# Patient Record
Sex: Female | Born: 1965 | Race: White | Hispanic: No | Marital: Married | Smoking: Current every day smoker
Health system: Southern US, Community
[De-identification: ages and names within clinical notes are randomized; demographics above are authoritative.]

## PROBLEM LIST (undated history)

## (undated) DIAGNOSIS — I639 Cerebral infarction, unspecified: Secondary | ICD-10-CM

## (undated) DIAGNOSIS — F32A Depression, unspecified: Secondary | ICD-10-CM

## (undated) DIAGNOSIS — R519 Other chronic pain: Secondary | ICD-10-CM

## (undated) DIAGNOSIS — G8929 Other chronic pain: Secondary | ICD-10-CM

## (undated) DIAGNOSIS — J439 Emphysema, unspecified: Secondary | ICD-10-CM

## (undated) DIAGNOSIS — F419 Anxiety disorder, unspecified: Secondary | ICD-10-CM

## (undated) DIAGNOSIS — Z9289 Personal history of other medical treatment: Secondary | ICD-10-CM

## (undated) DIAGNOSIS — K635 Polyp of colon: Secondary | ICD-10-CM

## (undated) DIAGNOSIS — R51 Headache: Secondary | ICD-10-CM

## (undated) DIAGNOSIS — F329 Major depressive disorder, single episode, unspecified: Secondary | ICD-10-CM

## (undated) DIAGNOSIS — K2970 Gastritis, unspecified, without bleeding: Secondary | ICD-10-CM

## (undated) DIAGNOSIS — Z720 Tobacco use: Secondary | ICD-10-CM

## (undated) DIAGNOSIS — K219 Gastro-esophageal reflux disease without esophagitis: Secondary | ICD-10-CM

## (undated) HISTORY — DX: Depression, unspecified: F32.A

## (undated) HISTORY — DX: Gastro-esophageal reflux disease without esophagitis: K21.9

## (undated) HISTORY — DX: Anxiety disorder, unspecified: F41.9

## (undated) HISTORY — PX: SKIN GRAFT: SHX250

## (undated) HISTORY — DX: Tobacco use: Z72.0

## (undated) HISTORY — DX: Emphysema, unspecified: J43.9

## (undated) HISTORY — PX: POLYPECTOMY: SHX149

## (undated) HISTORY — PX: EXTERNAL EAR SURGERY: SHX627

## (undated) HISTORY — PX: COLONOSCOPY: SHX174

## (undated) HISTORY — DX: Major depressive disorder, single episode, unspecified: F32.9

---

## 2000-04-16 ENCOUNTER — Emergency Department (HOSPITAL_COMMUNITY): Admission: EM | Admit: 2000-04-16 | Discharge: 2000-04-16 | Payer: Self-pay | Admitting: *Deleted

## 2002-10-10 ENCOUNTER — Emergency Department (HOSPITAL_COMMUNITY): Admission: EM | Admit: 2002-10-10 | Discharge: 2002-10-10 | Payer: Self-pay | Admitting: Emergency Medicine

## 2004-02-20 ENCOUNTER — Emergency Department (HOSPITAL_COMMUNITY): Admission: EM | Admit: 2004-02-20 | Discharge: 2004-02-20 | Payer: Self-pay | Admitting: Emergency Medicine

## 2004-02-23 ENCOUNTER — Ambulatory Visit (HOSPITAL_COMMUNITY): Admission: RE | Admit: 2004-02-23 | Discharge: 2004-02-23 | Payer: Self-pay | Admitting: *Deleted

## 2004-02-28 ENCOUNTER — Ambulatory Visit (HOSPITAL_COMMUNITY): Admission: RE | Admit: 2004-02-28 | Discharge: 2004-02-28 | Payer: Self-pay | Admitting: *Deleted

## 2005-08-12 ENCOUNTER — Emergency Department (HOSPITAL_COMMUNITY): Admission: EM | Admit: 2005-08-12 | Discharge: 2005-08-12 | Payer: Self-pay | Admitting: Emergency Medicine

## 2006-03-13 ENCOUNTER — Emergency Department (HOSPITAL_COMMUNITY): Admission: EM | Admit: 2006-03-13 | Discharge: 2006-03-13 | Payer: Self-pay | Admitting: Emergency Medicine

## 2006-08-25 ENCOUNTER — Emergency Department (HOSPITAL_COMMUNITY): Admission: EM | Admit: 2006-08-25 | Discharge: 2006-08-25 | Payer: Self-pay | Admitting: Emergency Medicine

## 2008-12-27 ENCOUNTER — Emergency Department (HOSPITAL_COMMUNITY): Admission: EM | Admit: 2008-12-27 | Discharge: 2008-12-27 | Payer: Self-pay | Admitting: Emergency Medicine

## 2009-05-03 ENCOUNTER — Emergency Department (HOSPITAL_COMMUNITY): Admission: EM | Admit: 2009-05-03 | Discharge: 2009-05-03 | Payer: Self-pay | Admitting: Emergency Medicine

## 2010-05-24 NOTE — Procedures (Signed)
   [name], [name] NO.:  [id number]   MEDICAL RECORD NO.:  [id number]                   PATIENT TYPE:  EMS   LOCATION:  ED                                   FACILITY:  [facility]   PHYSICIAN:  Edward L. Juanetta Gosling, M.D.             DATE OF BIRTH:  [birth year]   DATE OF PROCEDURE:  10/10/2002  DATE OF DISCHARGE:                                EKG INTERPRETATION   1029 hours on October 10, 2002.   RESULTS:  The rhythm is sinus rhythm with rate in the 70s.  There is  probable left atrial enlargement.  Small Q waves are seen inferiorly and  laterally and these are probably of no clinical significance, but  correlation is suggestion.   IMPRESSION:  Minimally abnormal electrocardiogram.      ___________________________________________                                            Oneal Deputy. Juanetta Gosling, M.D.   ELH/MEDQ  D:  10/10/2002  T:  10/11/2002  Job:  [id number]

## 2011-03-03 ENCOUNTER — Emergency Department (HOSPITAL_COMMUNITY)
Admission: EM | Admit: 2011-03-03 | Discharge: 2011-03-03 | Disposition: A | Discharge status: Home / Self Care | Payer: BC Managed Care – PPO | Attending: Emergency Medicine | Admitting: Emergency Medicine

## 2011-03-03 ENCOUNTER — Encounter (HOSPITAL_COMMUNITY): Payer: Self-pay | Admitting: Emergency Medicine

## 2011-03-03 ENCOUNTER — Emergency Department (HOSPITAL_COMMUNITY): Payer: BC Managed Care – PPO

## 2011-03-03 ENCOUNTER — Other Ambulatory Visit: Payer: Self-pay

## 2011-03-03 DIAGNOSIS — M79609 Pain in unspecified limb: Secondary | ICD-10-CM | POA: Insufficient documentation

## 2011-03-03 DIAGNOSIS — R079 Chest pain, unspecified: Secondary | ICD-10-CM | POA: Insufficient documentation

## 2011-03-03 DIAGNOSIS — R209 Unspecified disturbances of skin sensation: Secondary | ICD-10-CM | POA: Insufficient documentation

## 2011-03-03 DIAGNOSIS — R5381 Other malaise: Secondary | ICD-10-CM | POA: Insufficient documentation

## 2011-03-03 LAB — BASIC METABOLIC PANEL
CO2: 24 mEq/L (ref 19–32)
Calcium: 9 mg/dL (ref 8.4–10.5)
GFR calc non Af Amer: >90 mL/min (ref >90)
Glucose, Bld: 88 mg/dL (ref 70–99)

## 2011-03-03 LAB — DIFFERENTIAL
Eosinophils Absolute: 0.2 10*3/uL (ref 0.0–0.7)
Eosinophils Relative: 3 % (ref 0–5)
Monocytes Absolute: 0.4 10*3/uL (ref 0.1–1.0)
Neutro Abs: 4 10*3/uL (ref 1.7–7.7)

## 2011-03-03 LAB — CBC
HCT: 39.3 % (ref 36.0–46.0)
Hemoglobin: 14 g/dL (ref 12.0–15.0)
MCH: 31.1 pg (ref 26.0–34.0)
MCV: 87.3 fL (ref 78.0–100.0)
Platelets: 215 10*3/uL (ref 150–400)
RBC: 4.5 MIL/uL (ref 3.87–5.11)
WBC: 6.7 10*3/uL (ref 4.0–10.5)

## 2011-03-03 LAB — POCT I-STAT TROPONIN I
Troponin i, poc: 0 ng/mL (ref 0.00–0.08)
Troponin i, poc: 0 ng/mL (ref 0.00–0.08)

## 2011-03-03 LAB — D-DIMER, QUANTITATIVE: D-Dimer, Quant: <0.22 ug/mL-FEU (ref 0.00–0.48)

## 2011-03-03 LAB — POCT PREGNANCY, URINE: Preg Test, Ur: NEGATIVE

## 2011-03-03 MED ORDER — OXYCODONE-ACETAMINOPHEN 5-325 MG PO TABS: 2.0000 | ORAL_TABLET | ORAL | Status: AC | PRN

## 2011-03-03 MED ORDER — FENTANYL CITRATE 0.05 MG/ML IJ SOLN
50.0000 ug | Freq: Once | INTRAMUSCULAR | Status: AC
  Administered 2011-03-03: 50 ug via NASAL
  Filled 2011-03-03: qty 2

## 2011-03-03 NOTE — Discharge Instructions (Signed)
Your caregiver has diagnosed you as having chest pain that is not specific for one problem, but does not require admission.  You are at low risk for an acute heart condition or other serious illness. Chest pain comes from many different causes.  SEEK IMMEDIATE MEDICAL ATTENTION IF: You have severe chest pain, especially if the pain is crushing or pressure-like and spreads to the arms, back, neck, or jaw, or if you have sweating, nausea (feeling sick to your stomach), or shortness of breath. THIS IS AN EMERGENCY. Don't wait to see if the pain will go away. Get medical help at once. Call 911 or 0 (operator). DO NOT drive yourself to the hospital.  Your chest pain gets worse and does not go away with rest.  You have an attack of chest pain lasting longer than usual, despite rest and treatment with the medications your caregiver has prescribed.  You wake from sleep with chest pain or shortness of breath.  You feel dizzy or faint.  You have chest pain not typical of your usual pain for which you originally saw your caregiver.  RESOURCE GUIDE  Dental Problems  Patients with Medicaid: [facility]                     [facility] [address]                                           [address] Phone:  [phone]                                                  Phone:  [phone]  If unable to pay or uninsured, contact:  [facility] or [facility] to become qualified for the adult dental clinic.  Chronic Pain Problems Contact [facility]  [phone] Patients need to be referred by their primary care doctor.  Insufficient Money for Medicine Contact [employer]:  call "211" or [facility] 271-5999.  No Primary Care Doctor Call [facility]  832-8000 Other agencies that provide inexpensive medical care    [facility]  [phone]     Internal Medicine  [phone]    [facility]   [phone]    Women's Clinic  [phone]    [facility]  [phone]    [facility]  [phone]  Psychological Services [facility]  [phone] [facility]  [phone] [facility]   800 853-5163 (emergency services 641-4993)  Substance Abuse Resources [facility]  336-882-2125 [facility] 336-784-9470 [facility] 336-285-9073 [facility] 336-845-3988 Residential & Outpatient Substance Abuse Program  800-659-3381  Abuse/Neglect [place] Child Abuse Hotline (336) 641-3795 [place] [facility] Abuse Hotline 800-378-5315 (After Hours)  Emergency Shelter [facility] (336) 271-5985  [facility] (336) 275-9566 [facility] (704) 372-4663  MRSA Hotline #:   832-7006    [place] [facility]  [facility] [place]     [employer]                            [facility] [address] [place]                       [address]      [address]  [place]                                                [place]                            [place] Phone:  [phone]                                   Phone:  [phone]                 Phone:  [phone]  [facility] Phone:  [phone]  [place] Child Abuse Hotline (336) 342-1394 (336) 342-3537 (After Hours)  

## 2011-03-03 NOTE — ED Provider Notes (Signed)
History     CSN: [id number]  Arrival date & time 03/03/11  1225   First MD Initiated Contact with Patient 03/03/11 1416      Chief Complaint  Patient presents with  . Chest Pain    (Consider location/radiation/quality/duration/timing/severity/associated sxs/prior treatment) HPI This 46 year old female had sudden onset sharp stabbing positional somewhat pleuritic and nonexertional diffuse chest pain radiating to both arms with both arms feeling numb since 10:30 this morning without fever cough shortness of breath abdominal pain lightheadedness or lateralizing weakness or numbness. She is no treatment prior to arrival. Pain is moderately severe. She has not had any recent immobilization or surgeries. Her sister and mother both had postoperative deep venous thrombosis in the past. Patient herself does not have any history of cancer. The patient's discomfort is constant. She does feel some generalized weakness over the last 4 hours as well. EMS gave the patient aspirin and nitroglycerin prior to arrival which made no difference. The patient lives in [place] has no primary care doctor History reviewed. No pertinent past medical history. COPD  History reviewed. No pertinent past surgical history.  History reviewed. No pertinent family history.  History  Substance Use Topics  . Smoking status: Current Everyday Smoker  . Smokeless tobacco: Not on file  . Alcohol Use: No    OB History    Grav Para Term Preterm Abortions TAB SAB Ect Mult Living                  Review of Systems  Constitutional: Negative for fever.       10 Systems reviewed and are negative for acute change except as noted in the HPI.  HENT: Negative for congestion.   Eyes: Negative for discharge and redness.  Respiratory: Negative for cough and shortness of breath.   Cardiovascular: Positive for chest pain.  Gastrointestinal: Negative for vomiting and abdominal pain.  Musculoskeletal: Negative for back pain.    Skin: Negative for rash.  Neurological: Positive for weakness. Negative for syncope, numbness and headaches.  Psychiatric/Behavioral:       No behavior change.    Allergies  Excedrin and Vicodin  Home Medications   Current Outpatient Rx  Name Route Sig Dispense Refill  . OXYCODONE-ACETAMINOPHEN 5-325 MG PO TABS Oral Take 2 tablets by mouth every 4 (four) hours as needed for pain. 6 tablet 0    BP 102/62  Pulse 60  Temp(Src) 98.9 F (37.2 C) (Oral)  Resp 18  SpO2 98%  LMP 02/07/2011  Physical Exam  Nursing note and vitals reviewed. Constitutional:       Awake, alert, nontoxic appearance.  HENT:  Head: Atraumatic.  Eyes: Right eye exhibits no discharge. Left eye exhibits no discharge.  Neck: Neck supple.  Cardiovascular: Normal rate and regular rhythm.   No murmur heard. Pulmonary/Chest: Effort normal and breath sounds normal. No respiratory distress. She has no wheezes. She has no rales. She exhibits tenderness.       The patient has reproducible anterior chest wall tenderness without deformity or rash  Abdominal: Soft. Bowel sounds are normal. There is no tenderness. There is no rebound.  Musculoskeletal: She exhibits no edema and no tenderness.       Baseline ROM, no obvious new focal weakness.  Neurological: She is alert.       Mental status and motor strength appears baseline for patient and situation.  Skin: No rash noted.  Psychiatric: She has a normal mood and affect.    ED Course  Procedures (including critical care time) ECG: Normal sinus rhythm, ventricular rate 62, normal axis, normal intervals, impression normal ECG, no comparison available  Labs Reviewed  BASIC METABOLIC PANEL  CBC  DIFFERENTIAL  D-DIMER, QUANTITATIVE  POCT PREGNANCY, URINE  POCT I-STAT TROPONIN I  POCT I-STAT TROPONIN I  LAB REPORT - SCANNED   Dg Chest 2 View  03/03/2011  *RADIOLOGY REPORT*  Clinical Data: Chest pain  CHEST - 2 VIEW  Comparison: None  Findings: The heart size  and mediastinal contours are within normal limits.  Both lungs are clear.  The visualized skeletal structures are unremarkable.  IMPRESSION: Negative exam.  Original Report Authenticated By: Rosealee Albee, M.D.     1. Chest pain       MDM  Pt stable in ED with no significant deterioration in condition.Patient informed of clinical course, understand medical decision-making process, and agree with plan.I doubt any other EMC precluding discharge at this time including, but not necessarily limited to the following:MI, PE, high risk ACS.        Hurman Horn, MD 03/04/11 [phone]

## 2011-03-03 NOTE — ED Notes (Signed)
Pt c/o constant cp to all over chest that is sharp and tingling down both arms with sob and generalized weakness since 1030am today.pt in nad. Nondiaphoretic. Denies n/v/d/sweating or dizziness.

## 2011-03-03 NOTE — ED Notes (Signed)
ekg given to edp, no further orders.

## 2011-04-03 ENCOUNTER — Encounter (HOSPITAL_COMMUNITY): Payer: Self-pay | Admitting: *Deleted

## 2011-04-03 ENCOUNTER — Emergency Department (HOSPITAL_COMMUNITY)
Admission: EM | Admit: 2011-04-03 | Discharge: 2011-04-03 | Disposition: A | Discharge status: Home / Self Care | Payer: Self-pay | Attending: Emergency Medicine | Admitting: Emergency Medicine

## 2011-04-03 DIAGNOSIS — Z886 Allergy status to analgesic agent status: Secondary | ICD-10-CM | POA: Insufficient documentation

## 2011-04-03 DIAGNOSIS — H6090 Unspecified otitis externa, unspecified ear: Secondary | ICD-10-CM

## 2011-04-03 DIAGNOSIS — H60399 Other infective otitis externa, unspecified ear: Secondary | ICD-10-CM | POA: Insufficient documentation

## 2011-04-03 DIAGNOSIS — F17200 Nicotine dependence, unspecified, uncomplicated: Secondary | ICD-10-CM | POA: Insufficient documentation

## 2011-04-03 MED ORDER — ANTIPYRINE-BENZOCAINE 5.4-1.4 % OT SOLN
3.0000 [drp] | Freq: Once | OTIC | Status: AC
  Administered 2011-04-03: 4 [drp] via OTIC
  Filled 2011-04-03: qty 10

## 2011-04-03 MED ORDER — NEOMYCIN-POLYMYXIN-HC 3.5-10000-1 OT SOLN
3.0000 [drp] | Freq: Once | OTIC | Status: AC
  Administered 2011-04-03: 3 [drp] via OTIC
  Filled 2011-04-03: qty 10

## 2011-04-03 NOTE — ED Notes (Signed)
Rt earache for 2 days, began bleeding yesterday, dizziness at times.

## 2011-04-03 NOTE — Discharge Instructions (Signed)
Otitis Externa Swimmer's ear (otitis externa) is an infection in the outer ear canal. It can be caused by a germ or a fungus. It may be caused by:  Swimming in dirty water.   Water that stays in the ear after swimming.  HOME CARE  Put drops in the ear canal as told by your doctor.   Only take medicine as told by your doctor.  GET HELP RIGHT AWAY IF:   You have a temperature by mouth above 102 F (38.9 C), not controlled by medicine.   There is ear pain after 3 days.  MAKE SURE YOU:   Understand these instructions.   Will watch this condition.   Will get help right away if you are not doing well or get worse.  Document Released: 06/11/2007 Document Revised: 12/12/2010 Document Reviewed: 06/11/2007 [facility] Patient Information 2012 [place], [place].    Apply 4 drops of the cortisporin in your right ear three times daily for the next 5 days.  You can apply the auralgan drop for pain relief - 3 drops every 4 hours as needed.  Ibuprofen or tylenol may also offer pain relief.  Get rechecked if not improved over the next week.

## 2011-04-03 NOTE — ED Notes (Signed)
Pt DC to home with steady gait 

## 2011-04-04 NOTE — ED Provider Notes (Signed)
History     CSN: [id number]  Arrival date & time 04/03/11  1037   First MD Initiated Contact with Patient 04/03/11 1115      Chief Complaint  Patient presents with  . Otalgia    (Consider location/radiation/quality/duration/timing/severity/associated sxs/prior treatment) Patient is a 46 y.o. female presenting with ear pain. The history is provided by the patient.  Otalgia This is a new problem. The current episode started 2 days ago. There is pain in the right ear. The problem occurs constantly. The problem has not changed since onset.There has been no fever. Associated symptoms include ear discharge. Pertinent negatives include no headaches, no hearing loss, no rhinorrhea, no sore throat, no abdominal pain, no vomiting, no neck pain, no cough and no rash. Associated symptoms comments: She reports ringing in her right ear which has been intermittent.. Her past medical history does not include chronic ear infection.    History reviewed. No pertinent past medical history.  Past Surgical History  Procedure Date  . External ear surgery     Family History  Problem Relation Age of Onset  . Heart failure Mother     History  Substance Use Topics  . Smoking status: Current Everyday Smoker  . Smokeless tobacco: Not on file  . Alcohol Use: No    OB History    Grav Para Term Preterm Abortions TAB SAB Ect Mult Living                  Review of Systems  Constitutional: Negative for fever.  HENT: Positive for ear pain and ear discharge. Negative for hearing loss, sore throat, rhinorrhea and neck pain.   Respiratory: Negative for cough.   Gastrointestinal: Negative for vomiting and abdominal pain.  Skin: Negative for rash.  Neurological: Negative for headaches.    Allergies  Excedrin and Vicodin  Home Medications  No current outpatient prescriptions on file.  BP 115/74  Pulse 83  Temp(Src) 98 F (36.7 C) (Oral)  Resp 17  Ht 5\' 5"  (1.651 m)  Wt 149 lb 7 oz (67.784 kg)   BMI 24.87 kg/m2  SpO2 99%  LMP 03/07/2011  Physical Exam  Nursing note and vitals reviewed. Constitutional: She is oriented to person, place, and time. She appears well-developed and well-nourished.  HENT:  Head: Normocephalic and atraumatic.  Right Ear: Hearing normal. There is drainage, swelling and tenderness. No foreign bodies. No mastoid tenderness. Tympanic membrane is not injected, not erythematous, not retracted and not bulging.  Left Ear: External ear normal.  Eyes: Conjunctivae are normal.  Neck: Normal range of motion.  Cardiovascular: Normal rate, regular rhythm, normal heart sounds and intact distal pulses.   Pulmonary/Chest: Effort normal and breath sounds normal. She has no wheezes.  Abdominal: Soft. Bowel sounds are normal. There is no tenderness.  Musculoskeletal: Normal range of motion.  Neurological: She is alert and oriented to person, place, and time.  Skin: Skin is warm and dry.  Psychiatric: She has a normal mood and affect.    ED Course  Procedures (including critical care time)  Labs Reviewed - No data to display No results found.   1. Otitis externa       MDM  Cortisporin and a Auralgan given in ED.  Also encouraged Tylenol or Motrin for pain reduction as needed.  Recheck by PCP in one week or sooner if symptoms worsen.      Candis Musa, PA 04/04/11 [phone]

## 2011-04-09 NOTE — ED Provider Notes (Signed)
Medical screening examination/treatment/procedure(s) were performed by non-physician practitioner and as supervising physician I was immediately available for consultation/collaboration.   Joya Gaskins, MD 04/09/11 [phone]

## 2014-02-07 ENCOUNTER — Encounter (HOSPITAL_COMMUNITY): Payer: Self-pay | Admitting: Emergency Medicine

## 2014-02-07 ENCOUNTER — Emergency Department (HOSPITAL_COMMUNITY): Payer: Self-pay

## 2014-02-07 ENCOUNTER — Emergency Department (HOSPITAL_COMMUNITY)
Admission: EM | Admit: 2014-02-07 | Discharge: 2014-02-07 | Disposition: A | Discharge status: Home / Self Care | Payer: Self-pay | Attending: Emergency Medicine | Admitting: Emergency Medicine

## 2014-02-07 DIAGNOSIS — M25512 Pain in left shoulder: Secondary | ICD-10-CM | POA: Insufficient documentation

## 2014-02-07 DIAGNOSIS — R0981 Nasal congestion: Secondary | ICD-10-CM | POA: Insufficient documentation

## 2014-02-07 DIAGNOSIS — M436 Torticollis: Secondary | ICD-10-CM | POA: Insufficient documentation

## 2014-02-07 DIAGNOSIS — M2550 Pain in unspecified joint: Secondary | ICD-10-CM | POA: Insufficient documentation

## 2014-02-07 DIAGNOSIS — H9209 Otalgia, unspecified ear: Secondary | ICD-10-CM | POA: Insufficient documentation

## 2014-02-07 DIAGNOSIS — M5412 Radiculopathy, cervical region: Secondary | ICD-10-CM | POA: Insufficient documentation

## 2014-02-07 DIAGNOSIS — R05 Cough: Secondary | ICD-10-CM | POA: Insufficient documentation

## 2014-02-07 DIAGNOSIS — Z720 Tobacco use: Secondary | ICD-10-CM | POA: Insufficient documentation

## 2014-02-07 LAB — CBC WITH DIFFERENTIAL/PLATELET
Basophils Absolute: 0 10*3/uL (ref 0.0–0.1)
Basophils Relative: 0 % (ref 0–1)
EOS PCT: 3 % (ref 0–5)
Eosinophils Absolute: 0.3 10*3/uL (ref 0.0–0.7)
HEMATOCRIT: 38.7 % (ref 36.0–46.0)
HEMOGLOBIN: 13.3 g/dL (ref 12.0–15.0)
Lymphocytes Relative: 20 % (ref 12–46)
Lymphs Abs: 1.9 10*3/uL (ref 0.7–4.0)
MCH: 30.6 pg (ref 26.0–34.0)
MCHC: 34.4 g/dL (ref 30.0–36.0)
MCV: 89.2 fL (ref 78.0–100.0)
Monocytes Absolute: 0.5 10*3/uL (ref 0.1–1.0)
Monocytes Relative: 5 % (ref 3–12)
NEUTROS PCT: 72 % (ref 43–77)
Neutro Abs: 6.7 10*3/uL (ref 1.7–7.7)
Platelets: 233 10*3/uL (ref 150–400)
RBC: 4.34 MIL/uL (ref 3.87–5.11)
RDW: 13.5 % (ref 11.5–15.5)
WBC: 9.4 10*3/uL (ref 4.0–10.5)

## 2014-02-07 LAB — BASIC METABOLIC PANEL
ANION GAP: 2 — AB (ref 5–15)
BUN: 11 mg/dL (ref 6–23)
CALCIUM: 8.8 mg/dL (ref 8.4–10.5)
CO2: 28 mmol/L (ref 19–32)
Chloride: 108 mmol/L (ref 96–112)
Creatinine, Ser: 0.75 mg/dL (ref 0.50–1.10)
GFR calc Af Amer: >90 mL/min (ref >90)
Glucose, Bld: 87 mg/dL (ref 70–99)
Potassium: 4.2 mmol/L (ref 3.5–5.1)
SODIUM: 138 mmol/L (ref 135–145)

## 2014-02-07 LAB — TROPONIN I

## 2014-02-07 MED ORDER — CYCLOBENZAPRINE HCL 10 MG PO TABS
10.0000 mg | ORAL_TABLET | Freq: Once | ORAL | Status: AC
  Administered 2014-02-07: 10 mg via ORAL
  Filled 2014-02-07: qty 1

## 2014-02-07 MED ORDER — PREDNISONE 50 MG PO TABS
60.0000 mg | ORAL_TABLET | Freq: Once | ORAL | Status: AC
  Administered 2014-02-07: 60 mg via ORAL
  Filled 2014-02-07 (×2): qty 1

## 2014-02-07 MED ORDER — PREDNISONE 10 MG PO TABS: ORAL_TABLET | ORAL | Status: DC

## 2014-02-07 MED ORDER — CYCLOBENZAPRINE HCL 5 MG PO TABS: 5.0000 mg | ORAL_TABLET | Freq: Three times a day (TID) | ORAL | Status: DC | PRN

## 2014-02-07 NOTE — ED Notes (Addendum)
Patient with left sided neck pain that radiates to left arm. States intermittent chest pain for a long time, worst at night and happens after eating. Burning pain per patient. No chest pain at present.

## 2014-02-07 NOTE — Care Management Note (Signed)
ED/CM noted patient did not have health insurance and/or PCP listed in the computer.  Patient is from [employer]- was given the handout for new health insurance sign-up and a Rx discount card. Patient expressed appreciation for information received.

## 2014-02-07 NOTE — Discharge Instructions (Signed)
Cervical Radiculopathy Cervical radiculopathy happens when a nerve in the neck is pinched or bruised by a slipped (herniated) disk or by arthritic changes in the bones of the cervical spine. This can occur due to an injury or as part of the normal aging process. Pressure on the cervical nerves can cause pain or numbness that runs from your neck all the way down into your arm and fingers. CAUSES  There are many possible causes, including:  Injury.  Muscle tightness in the neck from overuse.  Swollen, painful joints (arthritis).  Breakdown or degeneration in the bones and joints of the spine (spondylosis) due to aging.  Bone spurs that may develop near the cervical nerves. SYMPTOMS  Symptoms include pain, weakness, or numbness in the affected arm and hand. Pain can be severe or irritating. Symptoms may be worse when extending or turning the neck. DIAGNOSIS  Your caregiver will ask about your symptoms and do a physical exam. He or she may test your strength and reflexes. X-rays, CT scans, and MRI scans may be needed in cases of injury or if the symptoms do not go away after a period of time. Electromyography (EMG) or nerve conduction testing may be done to study how your nerves and muscles are working. TREATMENT  Your caregiver may recommend certain exercises to help relieve your symptoms. Cervical radiculopathy can, and often does, get better with time and treatment. If your problems continue, treatment options may include:  Wearing a soft collar for short periods of time.  Physical therapy to strengthen the neck muscles.  Medicines, such as nonsteroidal anti-inflammatory drugs (NSAIDs), oral corticosteroids, or spinal injections.  Surgery. Different types of surgery may be done depending on the cause of your problems. HOME CARE INSTRUCTIONS   Put ice on the affected area.  Put ice in a plastic bag.  Place a towel between your skin and the bag.  Leave the ice on for 15-20 minutes,  03-04 times a day or as directed by your caregiver.  If ice does not help, you can try using heat. Take a warm shower or bath, or use a hot water bottle as directed by your caregiver.  You may try a gentle neck and shoulder massage.  Use a flat pillow when you sleep.  Only take over-the-counter or prescription medicines for pain, discomfort, or fever as directed by your caregiver.  If physical therapy was prescribed, follow your caregiver's directions.  If a soft collar was prescribed, use it as directed. SEEK IMMEDIATE MEDICAL CARE IF:   Your pain gets much worse and cannot be controlled with medicines.  You have weakness or numbness in your hand, arm, face, or leg.  You have a high fever or a stiff, rigid neck.  You lose bowel or bladder control (incontinence).  You have trouble with walking, balance, or speaking. MAKE SURE YOU:   Understand these instructions.  Will watch your condition.  Will get help right away if you are not doing well or get worse. Document Released: 09/17/2000 Document Revised: 03/17/2011 Document Reviewed: 08/06/2010 [facility] Patient Information 2015 [place], [place]. This information is not intended to replace advice given to you by your health care provider. Make sure you discuss any questions you have with your health care provider.    Emergency Department Resource Guide 1) Find a Doctor and Pay Out of Pocket Although you won't have to find out who is covered by your insurance plan, it is a good idea to ask around and get recommendations. You  will then need to call the office and see if the doctor you have chosen will accept you as a new patient and what types of options they offer for patients who are self-pay. Some doctors offer discounts or will set up payment plans for their patients who do not have insurance, but you will need to ask so you aren't surprised when you get to your appointment.  2) Contact Your Local Health Department Not all  health departments have doctors that can see patients for sick visits, but many do, so it is worth a call to see if yours does. If you don't know where your local health department is, you can check in your phone book. The CDC also has a tool to help you locate your state's health department, and many state websites also have listings of all of their local health departments.  3) Find a [facility] If your illness is not likely to be very severe or complicated, you may want to try a walk in clinic. These are popping up all over the country in pharmacies, drugstores, and shopping centers. They're usually staffed by nurse practitioners or physician assistants that have been trained to treat common illnesses and complaints. They're usually fairly quick and inexpensive. However, if you have serious medical issues or chronic medical problems, these are probably not your best option.  No Primary Care Doctor: - Call Health Connect at  [phone] - they can help you locate a primary care doctor that  accepts your insurance, provides certain services, etc. - Physician Referral Service- [phone]  Chronic Pain Problems: Organization         Address  Phone   Notes  [facility]  [phone] Patients need to be referred by their primary care doctor.   Medication Assistance: Organization         Address  Phone   Notes  [facility] Medication [facility] [place] [place], [place] [address] [phone] --Must be a resident of [facility] -- Must have NO insurance coverage whatsoever (no Medicaid/ Medicare, etc.) -- The pt. MUST have a primary care doctor that directs their care regularly and follows them in the community   [facility]ist  [phone]   [employer]  [phone]    Agencies that provide inexpensive medical care: Organization         Address  Phone   Notes  [place]  [phone]   [facility]     [phone]   [facility] [place], [place] [address] [phone]   [place] [address], [place] [phone]   Planned Parenthood    [phone]   [facility]    [phone]   [place] and [place] [address], [place] Phone:  [phone], Fax:  [phone] Hours of Operation:  9 am - 6 pm, M-F.  Also accepts Medicaid/Medicare and self-pay.  [facility] [place], [address],  Phone: [phone], Fax: [phone]. Hours of Operation:  8:30 am - 5:30 pm, M-F.  Also accepts Medicaid and self-pay.  [facility] [address] [place] Phone: [phone]   [place], [place], [place] [phone], Ext. [phone] Mondays & Thursdays: 7-9 AM.  First 15 patients are seen on a first come, first  serve basis.    [place] Providers:  Organization         Address  Phone   Notes  [facility] [address], [place] [phone] Also accepts self-pay patients.  [facility] [address], [place]  [phone]   [place], [address], [place] [phone]   [facility] [address], [place] [phone]   Lucianne Lei [address], [place]   [phone] Only accepts [place] Access [place] patients after they have their name applied to their card.   Self-Pay (no insurance) in [facility]:  Organization         Address  Phone   Notes  Sickle Cell Patients, [facility] [place] [phone]   [facility] [place] [phone]   [facility] [place]  [place], [place], [place] [phone]     [facility]/Dr. Osei-Bonsu  [address] [place] or [address], [place] [phone] Phone number for both [place] and [place] locations is the same.  Urgent [facility] and [facility] [address], [place] [phone]   [facility] [address] [place] or [address] [phone]   [facility] [address], [place] [phone], phone; [phone], fax Sees patients 1st and 3rd Saturday of every month.  Must not qualify for public or private insurance (i.e. Medicaid, Medicare, [place] Health Choice, Veterans' Benefits)  Household income should be no more than 200% of the poverty level The clinic cannot treat you if you are pregnant or think you are pregnant  Sexually transmitted diseases are not treated at the clinic.    Dental Care: Organization         Address  Phone  Notes  [facility] of [facility] [phone] Accepts children up to age 82 who are enrolled in [place] or [place]; pregnant women with a Medicaid card; and children who have applied for Medicaid or [place] Health Choice, but were declined, whose parents can pay a reduced fee at time of service.  [facility] Department of [facility]  [address], [place] [phone] Accepts children up to age 31 who are enrolled in [place] or [place]; pregnant women with a Medicaid card; and children who have applied for Medicaid or  Health Choice, but were declined, whose parents can pay a reduced fee at time of service.  [facility] Adult [facility] Access PROGRAM  [place] [phone] Patients are seen by appointment only. Walk-ins are not accepted. [place] will see patients 104 years of age and older. Monday - Tuesday (8am-5pm) Most Wednesdays (8:30-5pm) $30 per visit,  cash only  [facility] Adult Dental Access PROGRAM  [address], [facility] [phone] Patients are seen by appointment only. Walk-ins are not accepted. [place] will see patients 41 years of age and older. One Wednesday Evening (Monthly: Volunteer Based).  $30 per visit, cash only  [place]  [phone] for adults; Children under age 46, call Graduate Pediatric Dentistry at [phone]. Children aged 82-14, please  call [phone] to request a pediatric application.  Dental services are provided in all areas of dental care including fillings, crowns and bridges, complete and partial dentures, implants, gum treatment, root canals, and extractions. Preventive care is also provided. Treatment is provided to both adults and children. Patients are selected via a lottery and there is often a waiting list.   [facility] [address], [place]  [phone] [email or web address]   [facility] [address] [place], [place] [phone], Ext. [phone] Second and Fourth Thursday of each month, opens at 6:30 AM; Clinic ends at 9 AM.  Patients are seen on a first-come first-served basis, and a limited number are seen during each clinic.   [facility]  [address] Hillard Danker [place], [place] [phone]   Eligibility Requirements You must have lived in [place], [place], or [place] counties for at least the last three months.   You cannot be eligible for state or federal sponsored [employer], including [employer], [place], or [employer].   You generally cannot be eligible for healthcare insurance through your employer.    How to apply: Eligibility screenings are held every Tuesday and Wednesday afternoon from 1:00 pm until 4:00 pm. You do not need an appointment for the interview!  [facility] [address] [place], [place]   [place]   [place] Department  [place]  [phone]    Behavioral Health Resources in the Community: Intensive Outpatient Programs Organization         Address  Phone  Notes  [place]. [address], [place], [place] [phone]   [facility] [address], [place], [place]   ADS: Alcohol & Drug Svcs [address], [place], [place]   [place] [address]  [place], [place] or [phone]   Substance Abuse Resources Organization         Address  Phone  Notes  Alcohol and Drug Services  [phone]   [place]  [phone]   [facility] [place]   Chinita Pester  [phone]   Residential & Outpatient Substance Abuse Program  [phone]   Psychological Services Organization         Address  Phone  Notes  [facility]  [place]  [phone]   [place] [address] [place] or [phone]    Mobile Crisis Teams Organization         Address  Phone  Notes  [facility], Mobile Crisis Care Unit  [phone]   [facility]  [address] [place], [place]   [place] Levels [address] [place] [phone]    Self-Help/Support Groups Organization         Address  Phone             Notes  [place]. of [place] - variety of support groups  Hope Call for more information  Narcotics Anonymous (NA), [facility] [address], [employer] [place]  2 meetings at this location   Special educational needs teacher         Address  Phone  Notes  [facility]idential [facility] [place],    [place]  [phone]   [place]  [id number], [place], [place]    [place], [place] [phone] Admissions: 8am-3pm M-F  Incentives Substance [place] [address]    [place], [place] [phone]   [facility] [address] [place], [place], [place]   [facility] [address],  [place], [place]   [facility] - Intensive Outpatient [address], [place], [place]   [facility] ([place].,  [place], [place] [phone] or [phone]   [facility] (RTS) [address] [place], [place] Accepts Medicaid  [facility] [address]  [place] [phone] Substance Abuse/Addiction Treatment   [facility] Organization         Address  Phone  Notes  [facility]  [phone]   Domenic Schwab, PhD [address] [place], [place]   [phone] or [phone]   [place], [place] [phone]   [facility] [address] [place], [place] [phone] Insurance/Medicaid/sponsorship through [facility] and Families [address]                                    [place], [place] [phone] [place] [address], [place] [phone]    Dr. Adele Schilder  [phone]   [facility] Dept. 1) [address],  2) [place] 3)  [place] [address], [place] [phone]  [phone]   [place] [phone] or [phone] (After Hours)      Take your next dose of prednisone tomorrow morning.  Use the the other medicines as directed.   You should get rechecked if your symptoms are not better over the next 5 days,  Or you develop increased pain,  Weakness in  your leg(s) or loss of bladder or bowel function - these are symptoms of a worse injury.

## 2014-02-09 NOTE — ED Provider Notes (Signed)
CSN: [id number]     Arrival date & time 02/07/14  1100 History   First MD Initiated Contact with Patient 02/07/14 1150     Chief Complaint  Patient presents with  . Arm Pain  . Torticollis     (Consider location/radiation/quality/duration/timing/severity/associated sxs/prior Treatment) The history is provided by the patient.   CALEYAH JR is a 49 y.o. female with no significant past medical history presenting with acute left sided neck pain and stiffness present for the past 5 days.  She describes pain and burning that shoots down her left arm worsened with leftward neck flexion or rotation which will start burning in her left chest intermittently after it reaches her fingertips.  She denies history of specific pain or neck injury.  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  She also endorses increasing cough productive of yellow sputum, nasal congestion and post nasal drip along with left ear ache.  Denies fevers, chills, sore throat, shortness of breath.  She has taken no pain relievers or cold medications.   She denies abdominal pain, nausea, vomiting, dizziness, headache, facial or sinus pain.    History reviewed. No pertinent past medical history. Past Surgical History  Procedure Laterality Date  . External ear surgery    . Skin graft     Family History  Problem Relation Age of Onset  . Heart failure Mother    History  Substance Use Topics  . Smoking status: Current Every Day Smoker  . Smokeless tobacco: Not on file  . Alcohol Use: No   OB History    No data available     Review of Systems  Constitutional: Negative for fever and chills.  HENT: Positive for congestion and ear pain. Negative for ear discharge, hearing loss, sinus pressure, sore throat and trouble swallowing.   Eyes: Negative.   Respiratory: Positive for cough. Negative for chest tightness, shortness of breath, wheezing and stridor.   Cardiovascular: Negative for  chest pain.  Gastrointestinal: Negative for nausea and abdominal pain.  Genitourinary: Negative.   Musculoskeletal: Positive for arthralgias and neck pain. Negative for joint swelling.  Skin: Negative.  Negative for rash and wound.  Neurological: Negative for dizziness, weakness, light-headedness, numbness and headaches.  Psychiatric/Behavioral: Negative.       Allergies  Aleve; Excedrin; and Vicodin  Home Medications   Prior to Admission medications   Medication Sig Start Date End Date Taking? Authorizing Provider  cyclobenzaprine (FLEXERIL) 5 MG tablet Take 1 tablet (5 mg total) by mouth 3 (three) times daily as needed for muscle spasms. 02/07/14   Evalee Jefferson, PA-C  predniSONE (DELTASONE) 10 MG tablet 6, 5, 4, 3, 2 then 1 tablet by mouth daily for 6 days total. 02/07/14   Evalee Jefferson, PA-C   BP 109/69 mmHg  Pulse 70  Temp(Src) 97.5 F (36.4 C) (Oral)  Resp 16  Wt 150 lb (68.04 kg)  SpO2 100%  LMP 01/17/2014 Physical Exam  Constitutional: She is oriented to person, place, and time. She appears well-developed and well-nourished.  HENT:  Head: Normocephalic and atraumatic.  Right Ear: Tympanic membrane, external ear and ear canal normal.  Left Ear: Tympanic membrane, external ear and ear canal normal.  Nose: Mucosal edema present. No rhinorrhea.  Mouth/Throat: Uvula is midline, oropharynx is clear and moist and mucous membranes are normal. No oropharyngeal exudate, posterior oropharyngeal edema, posterior oropharyngeal erythema or tonsillar abscesses.  Eyes: Conjunctivae are normal.  Neck: Normal range of motion. Muscular  tenderness present. No spinous process tenderness present. No rigidity. No edema present.  ttp left trapezius.  Pain worsens with left head rotation, limited ROM in this direction only.  Cardiovascular: Normal rate and normal heart sounds.   Pulmonary/Chest: Effort normal. No respiratory distress. She has no wheezes. She has no rales.  Abdominal: Soft. There is  no tenderness.  Musculoskeletal: Normal range of motion. She exhibits no tenderness.  Neurological: She is alert and oriented to person, place, and time. She has normal strength. No cranial nerve deficit or sensory deficit.  Reflex Scores:      Bicep reflexes are 2+ on the right side and 2+ on the left side. Equal grip strength.  Skin: Skin is warm and dry. No rash noted.  Psychiatric: She has a normal mood and affect.    ED Course  Procedures (including critical care time) Labs Review Labs Reviewed  BASIC METABOLIC PANEL - Abnormal; Notable for the following:    Anion gap 2 (*)    All other components within normal limits  CBC WITH DIFFERENTIAL/PLATELET  TROPONIN I    Imaging Review No results found.   EKG Interpretation   Date/Time:  Tuesday February 07 2014 11:46:59 EST Ventricular Rate:  68 PR Interval:  178 QRS Duration: 88 QT Interval:  402 QTC Calculation: 427 R Axis:   11 Text Interpretation:  Normal sinus rhythm Low voltage QRS Borderline ECG  Confirmed by COOK  MD, BRIAN ([id number]) on 02/07/2014 11:53:58 AM      MDM   Final diagnoses:  Torticollis, acute  Cervical radiculopathy    Exam c/w with torticollis and URI.  No neuro deficit findings on exam.    Patients labs and/or radiological studies were viewed and considered during the medical decision making and disposition process. Pt prescribed course of prednisone, flexeril.  Discussed heat tx followed by ROM of neck and neck massage.  Left trapezius spasm.  Referrals given for establishing pcp.  Prn f/u anticipated.    Evalee Jefferson, PA-C 02/09/14 1837  Nat Christen, MD 02/18/14 1415

## 2014-12-06 ENCOUNTER — Encounter: Payer: Self-pay | Admitting: Pediatrics

## 2014-12-06 ENCOUNTER — Ambulatory Visit (INDEPENDENT_AMBULATORY_CARE_PROVIDER_SITE_OTHER): Payer: BLUE CROSS/BLUE SHIELD | Admitting: Pediatrics

## 2014-12-06 VITALS — BP 118/69 | HR 65 | Temp 97.4°F | Ht 65.0 in | Wt 162.0 lb

## 2014-12-06 DIAGNOSIS — Z800 Family history of malignant neoplasm of digestive organs: Secondary | ICD-10-CM

## 2014-12-06 DIAGNOSIS — R32 Unspecified urinary incontinence: Secondary | ICD-10-CM

## 2014-12-06 DIAGNOSIS — Z0000 Encounter for general adult medical examination without abnormal findings: Secondary | ICD-10-CM

## 2014-12-06 DIAGNOSIS — N3000 Acute cystitis without hematuria: Secondary | ICD-10-CM

## 2014-12-06 DIAGNOSIS — N644 Mastodynia: Secondary | ICD-10-CM

## 2014-12-06 DIAGNOSIS — Z803 Family history of malignant neoplasm of breast: Secondary | ICD-10-CM

## 2014-12-06 DIAGNOSIS — Z720 Tobacco use: Secondary | ICD-10-CM

## 2014-12-06 LAB — POCT URINALYSIS DIPSTICK
Bilirubin, UA: NEGATIVE
GLUCOSE UA: NEGATIVE
Ketones, UA: NEGATIVE
Nitrite, UA: POSITIVE
PH UA: 5
Spec Grav, UA: 1.025
UROBILINOGEN UA: NEGATIVE

## 2014-12-06 LAB — POCT UA - MICROSCOPIC ONLY
Casts, Ur, LPF, POC: NEGATIVE
Crystals, Ur, HPF, POC: NEGATIVE
YEAST UA: NEGATIVE

## 2014-12-06 NOTE — Progress Notes (Signed)
Subjective:    Patient ID: Heather [name], female    DOB: August 02, 1965, 49 y.o.   MRN: [id number]  CC: CPE  HPI: Heather [name] is a 49 y.o. female presenting for New Patient (Initial Visit)  L breast has been sore for past week, not noticed any redness, lumps or bumps Hurts constantly Sharp pain, hurts all around breast, there isnt one spot that hurts the most Last pap smear years ago Periods now twice a month for several months, LMP 18-21 of Nov and 1-3 of Nov this month, heavy periods, lots of cramping Hot flashes Jan 1 planned quit date. Smoking 2-3 cig a day now Works at [employer] Regular bowel movements, no blood in stool, no constipation Mom with colon ca diagnosed at 47yo Occasionally has sharp chest pain while working, lasts for a couple of seconds Incontinence for the last several weeks, sometimes with laughing or sneezing but also happens out of the blue, sometimes with exercise or walking, or when trying to get to the bathroom No fevers No weight changes up or down Mood has been fine    Depression screen [facility] 2/9 12/06/2014  Decreased Interest 0  Down, Depressed, Hopeless 0  PHQ - 2 Score 0    ROS: All systems negative other than what is in HPI  Past Medical History Tobacco abuse  Social History   Social History  . Marital Status: Married    Spouse Name: N/A  . Number of Children: N/A  . Years of Education: N/A   Occupational History  . Not on file.   Social History Main Topics  . Smoking status: Current Every Day Smoker -- 0.50 packs/day    Types: Cigarettes  . Smokeless tobacco: Not on file  . Alcohol Use: No  . Drug Use: No  . Sexual Activity: No   Other Topics Concern  . Not on file   Social History Narrative   Family History  Problem Relation Age of Onset  . Heart failure Mother   . Cancer Mother 38    47yo colon cancer, 35s bladder cancer  . Heart disease Father   . Breast cancer Sister   . Heart disease Maternal Aunt   . Heart  disease Maternal Uncle   . Cancer Paternal Aunt 60    colon cancer     No current outpatient prescriptions on file.   No current facility-administered medications for this visit.       Objective:    BP 118/69 mmHg  Pulse 65  Temp(Src) 97.4 F (36.3 C) (Oral)  Ht 5' 5"  (1.651 m)  Wt 162 lb (73.483 kg)  BMI 26.96 kg/m2  Wt Readings from Last 3 Encounters:  12/06/14 162 lb (73.483 kg)  02/07/14 150 lb (68.04 kg)  04/03/11 149 lb 7 oz (67.784 kg)    Gen: NAD, alert, cooperative with exam, NCAT EYES: EOMI, no scleral injection or icterus ENT:  TMs pearly gray b/l, OP without erythema LYMPH: no cervical LAD CV: NRRR, normal S1/S2, no murmur, distal pulses 2+ b/l Resp: CTABL, no wheezes, normal WOB Abd: +BS, soft, NTND. no guarding or organomegaly Ext: No edema, warm Neuro: Alert and oriented, strength equal b/l UE and LE, coordination grossly normal MSK: normal muscle bulk GU: Normal external female genitalia, small amount thin white mucus in the vaginal vault. Cervix slightly friable.  Breast: Normal breast exam b/l, soft tissue, no nodules, no redness. L breast with diffuse tenderness with palpation.     Assessment &  Plan:    Tymira was seen today for CPE.  Diagnoses and all orders for this visit:  Encounter for preventive health examination Screening for cervical cancer -     BMP8+EGFR -     CBC -     Lipid panel -     MM Digital Screening; Future -     Pap IG and HPV (high risk) DNA detection  Tobacco abuse Very interested in cessation. Is working down on the number of cigarettes. Goal cessation Jan 1. I spent more than 3 minutes discussing in smoking cessation strategies counseling.  Incontinence combination stress and urgency incontinence. Discussed timed voiding, kegel exercises, minimizing caffeinated beverages. RTC 3 mo if still troublesome, consider medication -     POCT urinalysis dipstick -     POCT UA - Microscopic Only  Family history of malignant  neoplasm of gastrointestinal tract Mother with colon ca dx at age 86yo. Pt has not had any colon cancer screening. Normal stooling habits now. -     Ambulatory referral to Gastroenterology   Family hx-breast malignancy in sister at age 35. Pt says she has never had a mammogram -     MM Digital Screening; Future  Breast pain, left Normal exam, no nodules or redness, though generally tender. Pt has had veyr irregular periods over past few months, likely going through menopause. Possibly hormone related, but pt is due for mammogram as above, ordered.   Follow up plan: 1 year, sooner if needed  [name], MD [facility] 12/06/2014, 9:13 AM

## 2014-12-06 NOTE — Patient Instructions (Signed)
To help with urine leakage: --Decrease caffeinated beverages to none or at most one a day --Kegel exercises, or pelvic floor exercises three times a day. Set an alarm to help remind yourself to do them. Hold for 5 seconds, then relax for 10 times --Go to the bathroom every 2 hours while at work

## 2014-12-07 ENCOUNTER — Other Ambulatory Visit: Payer: Self-pay | Admitting: Pediatrics

## 2014-12-07 DIAGNOSIS — R52 Pain, unspecified: Secondary | ICD-10-CM

## 2014-12-07 LAB — LIPID PANEL
CHOL/HDL RATIO: 3.2 ratio (ref 0.0–4.4)
Cholesterol, Total: 148 mg/dL (ref 100–199)
HDL: 46 mg/dL (ref >39)
LDL Calculated: 90 mg/dL (ref 0–99)
TRIGLYCERIDES: 62 mg/dL (ref 0–149)
VLDL Cholesterol Cal: 12 mg/dL (ref 5–40)

## 2014-12-07 LAB — CBC
HEMATOCRIT: 41.3 % (ref 34.0–46.6)
Hemoglobin: 14.1 g/dL (ref 11.1–15.9)
MCH: 31 pg (ref 26.6–33.0)
MCHC: 34.1 g/dL (ref 31.5–35.7)
MCV: 91 fL (ref 79–97)
Platelets: 261 10*3/uL (ref 150–379)
RBC: 4.55 x10E6/uL (ref 3.77–5.28)
RDW: 13.5 % (ref 12.3–15.4)
WBC: 9.4 10*3/uL (ref 3.4–10.8)

## 2014-12-07 LAB — BMP8+EGFR
BUN / CREAT RATIO: 16 (ref 9–23)
BUN: 12 mg/dL (ref 6–24)
CHLORIDE: 101 mmol/L (ref 97–106)
CO2: 26 mmol/L (ref 18–29)
Calcium: 9.6 mg/dL (ref 8.7–10.2)
Creatinine, Ser: 0.74 mg/dL (ref 0.57–1.00)
GFR calc non Af Amer: 97 mL/min/{1.73_m2} (ref >59)
GFR, EST AFRICAN AMERICAN: 112 mL/min/{1.73_m2} (ref >59)
Glucose: 89 mg/dL (ref 65–99)
Potassium: 4 mmol/L (ref 3.5–5.2)
Sodium: 139 mmol/L (ref 136–144)

## 2014-12-08 LAB — URINE CULTURE

## 2014-12-08 MED ORDER — NITROFURANTOIN MONOHYD MACRO 100 MG PO CAPS: 100.0000 mg | ORAL_CAPSULE | Freq: Two times a day (BID) | ORAL | Status: DC

## 2014-12-08 NOTE — Addendum Note (Signed)
Addended by: Eustaquio Maize on: 12/08/2014 05:36 PM   Modules accepted: Orders

## 2014-12-11 ENCOUNTER — Encounter: Payer: Self-pay | Admitting: Internal Medicine

## 2014-12-13 LAB — PAP IG AND HPV HIGH-RISK
HPV, high-risk: NEGATIVE
PAP Smear Comment: 0

## 2014-12-19 ENCOUNTER — Ambulatory Visit (HOSPITAL_COMMUNITY)
Admission: RE | Admit: 2014-12-19 | Discharge: 2014-12-19 | Disposition: A | Discharge status: Home / Self Care | Payer: BLUE CROSS/BLUE SHIELD | Source: Ambulatory Visit | Attending: Pediatrics | Admitting: Pediatrics

## 2014-12-19 DIAGNOSIS — N644 Mastodynia: Secondary | ICD-10-CM | POA: Insufficient documentation

## 2014-12-19 DIAGNOSIS — R52 Pain, unspecified: Secondary | ICD-10-CM

## 2015-01-05 ENCOUNTER — Telehealth: Payer: Self-pay | Admitting: Pediatrics

## 2015-01-05 NOTE — Telephone Encounter (Signed)
No appts available. Advised patient to go to nearest Urgent Care or ER. Patient verbalized understanding

## 2015-01-11 ENCOUNTER — Encounter: Payer: Self-pay | Admitting: *Deleted

## 2015-01-31 ENCOUNTER — Telehealth: Payer: Self-pay | Admitting: Pediatrics

## 2015-01-31 NOTE — Telephone Encounter (Signed)
Patient given an appointment for tomorrow with Evette Doffing

## 2015-02-01 ENCOUNTER — Ambulatory Visit (INDEPENDENT_AMBULATORY_CARE_PROVIDER_SITE_OTHER): Payer: BLUE CROSS/BLUE SHIELD | Admitting: Pediatrics

## 2015-02-01 ENCOUNTER — Encounter: Payer: Self-pay | Admitting: Pediatrics

## 2015-02-01 VITALS — BP 116/73 | HR 74 | Temp 97.1°F | Ht 65.0 in | Wt 167.0 lb

## 2015-02-01 DIAGNOSIS — F411 Generalized anxiety disorder: Secondary | ICD-10-CM | POA: Diagnosis not present

## 2015-02-01 DIAGNOSIS — N3000 Acute cystitis without hematuria: Secondary | ICD-10-CM

## 2015-02-01 DIAGNOSIS — R8281 Pyuria: Secondary | ICD-10-CM

## 2015-02-01 DIAGNOSIS — N390 Urinary tract infection, site not specified: Secondary | ICD-10-CM

## 2015-02-01 DIAGNOSIS — R200 Anesthesia of skin: Secondary | ICD-10-CM | POA: Diagnosis not present

## 2015-02-01 DIAGNOSIS — E559 Vitamin D deficiency, unspecified: Secondary | ICD-10-CM | POA: Diagnosis not present

## 2015-02-01 DIAGNOSIS — T148XXA Other injury of unspecified body region, initial encounter: Secondary | ICD-10-CM

## 2015-02-01 LAB — POCT UA - MICROSCOPIC ONLY
CRYSTALS, UR, HPF, POC: NEGATIVE
Casts, Ur, LPF, POC: NEGATIVE
RBC, URINE, MICROSCOPIC: NEGATIVE
YEAST UA: NEGATIVE

## 2015-02-01 LAB — POCT URINALYSIS DIPSTICK
Bilirubin, UA: NEGATIVE
Blood, UA: NEGATIVE
Glucose, UA: NEGATIVE
Ketones, UA: NEGATIVE
Nitrite, UA: POSITIVE
PROTEIN UA: NEGATIVE
Spec Grav, UA: 1.015
Urobilinogen, UA: NEGATIVE
pH, UA: 7.5

## 2015-02-01 MED ORDER — CITALOPRAM HYDROBROMIDE 20 MG PO TABS: 20.0000 mg | ORAL_TABLET | Freq: Every day | ORAL | Status: DC

## 2015-02-01 NOTE — Progress Notes (Signed)
  Subjective:    Patient ID: Heather [name], female    DOB: 06/04/1965, 50 y.o.   MRN: [id number]  CC: Fatigue; Numbness in legs; and Bleeding/Bruising   HPI: Heather [name] is a 50 y.o. female presenting for Fatigue; Numbness in legs; and Bleeding/Bruising  Presents with a female friend. Symptoms ongoing past week, noticed bruises inside R thigh a few days ago, has had lots of fatigue. Feels numb in her LE, but also with some pain that goes down from lower back into her legs. Denies tingling pain in feet or legs Nothing makes pain better or worse Appetite has been fine No night sweats No change in meds Has been very stressed recently at work and at home Pt wonders if increased stress can  Cause these symptoms No abd pain No chest pain, no SOB. Not on any medicines, denies use of drugs or OTC drugs or other medications. Denies EtOH    Depression screen PHQ 2/9 02/01/2015 12/06/2014  Decreased Interest 0 0  Down, Depressed, Hopeless 0 0  PHQ - 2 Score 0 0     Relevant past medical, surgical, family and social history reviewed and updated as indicated. Interim medical history since our last visit reviewed. Allergies and medications reviewed and updated.    ROS: Per HPI unless specifically indicated above  History  Smoking status  . Current Every Day Smoker -- 0.50 packs/day  . Types: Cigarettes  Smokeless tobacco  . Not on file    Past Medical History Patient Active Problem List   Diagnosis Date Noted  . Vitamin D deficiency 02/03/2015  . Generalized anxiety disorder 02/03/2015  . Numbness 02/03/2015        Objective:    BP 116/73 mmHg  Pulse 74  Temp(Src) 97.1 F (36.2 C) (Oral)  Ht 5' 5" (1.651 m)  Wt 167 lb (75.751 kg)  BMI 27.79 kg/m2  Wt Readings from Last 3 Encounters:  02/01/15 167 lb (75.751 kg)  12/06/14 162 lb (73.483 kg)  02/07/14 150 lb (68.04 kg)     Gen: NAD, alert, cooperative with exam, NCAT EYES: EOMI, no scleral injection or  icterus ENT:  TMs pearly gray b/l, OP without erythema or petechiae LYMPH: no cervical LAD CV: NRRR, normal S1/S2, no murmur, distal pulses 2+ b/l Resp: CTABL, no wheezes, normal WOB Abd: +BS, soft, NTND. no guarding or organomegaly Ext: No edema, warm Neuro: Alert and oriented, strength 4/5 b/l LE with ext/flex at knee, ankle. Slightly stronger R>L. CN III-XII intact. Rapid alternating movements intact. Romberg negative, patellar reflex present b/l, gait slightly wide based. Sensation "different" below approx T 7 R side of back and T11 L side. Points to lower back for where pain is. Sensation remains different throughout LE. Normal UE. MSK: normal muscle bulk Skin: several 1-3 cm bruises on inner L thigh in various stages of healing. No other bruising or petechiae noted.  Psych: full affect, slightly worried, no thoughts of self harm     Assessment & Plan:    Heather [name] was seen today for fatigue, numbness in legs and bleeding/bruising of unclear etiology. Neuro exam is inconsistent with symptoms. Not obviously neuropathy or compressed nerve. Will start with below labs. Pt found to have UTI and vit D def, will treat. Pt also asking if symptoms could be related to anxiety and says stress has been high at home. She wants to start a medication for anxiety, started citalopram. RTC 2 weeks, sooner if needed. Says she feels safe   at home.  Diagnoses and all orders for this visit:  Bruising -     POCT UA - Microscopic Only -     POCT urinalysis dipstick -     ToxASSURE Select 13 (MW), Urine -     CBC with Differential/Platelet -     CMP14+EGFR -     VITAMIN D 25 Hydroxy (Vit-D Deficiency, Fractures) -     B12 and Folate Panel  Numbness -     POCT UA - Microscopic Only -     POCT urinalysis dipstick -     ToxASSURE Select 13 (MW), Urine -     CBC with Differential/Platelet -     CMP14+EGFR -     VITAMIN D 25 Hydroxy (Vit-D Deficiency, Fractures) -     B12 and Folate Panel  Generalized  anxiety disorder -     citalopram (CELEXA) 20 MG tablet; Take 1 tablet (20 mg total) by mouth daily.  Pyuria -     Urine culture  Vitamin D deficiency -     Vitamin D, Ergocalciferol, (DRISDOL) 50000 units CAPS capsule; Take 1 capsule (50,000 Units total) by mouth every 7 (seven) days.  Acute cystitis without hematuria -     nitrofurantoin, macrocrystal-monohydrate, (MACROBID) 100 MG capsule; Take 1 capsule (100 mg total) by mouth 2 (two) times daily. 1 po BId     Follow up plan: Return in about 2 weeks (around 02/15/2015).  [name], MD [facility] 02/01/2015, 4:10 PM

## 2015-02-03 DIAGNOSIS — R200 Anesthesia of skin: Secondary | ICD-10-CM | POA: Insufficient documentation

## 2015-02-03 DIAGNOSIS — E559 Vitamin D deficiency, unspecified: Secondary | ICD-10-CM | POA: Insufficient documentation

## 2015-02-03 DIAGNOSIS — F411 Generalized anxiety disorder: Secondary | ICD-10-CM | POA: Insufficient documentation

## 2015-02-03 LAB — URINE CULTURE

## 2015-02-03 MED ORDER — VITAMIN D (ERGOCALCIFEROL) 1.25 MG (50000 UNIT) PO CAPS: 50000.0000 [IU] | ORAL_CAPSULE | ORAL | Status: DC

## 2015-02-04 MED ORDER — NITROFURANTOIN MONOHYD MACRO 100 MG PO CAPS: 100.0000 mg | ORAL_CAPSULE | Freq: Two times a day (BID) | ORAL | Status: DC

## 2015-02-06 LAB — CMP14+EGFR
ALBUMIN: 4.1 g/dL (ref 3.5–5.5)
ALK PHOS: 73 IU/L (ref 39–117)
ALT: 10 IU/L (ref 0–32)
AST: 16 IU/L (ref 0–40)
Albumin/Globulin Ratio: 1.8 (ref 1.1–2.5)
BUN / CREAT RATIO: 15 (ref 9–23)
BUN: 11 mg/dL (ref 6–24)
Bilirubin Total: 0.3 mg/dL (ref 0.0–1.2)
CHLORIDE: 102 mmol/L (ref 96–106)
CO2: 23 mmol/L (ref 18–29)
Calcium: 9.1 mg/dL (ref 8.7–10.2)
Creatinine, Ser: 0.73 mg/dL (ref 0.57–1.00)
GFR calc Af Amer: 113 mL/min/{1.73_m2} (ref >59)
GFR calc non Af Amer: 98 mL/min/{1.73_m2} (ref >59)
GLOBULIN, TOTAL: 2.3 g/dL (ref 1.5–4.5)
GLUCOSE: 98 mg/dL (ref 65–99)
POTASSIUM: 4.3 mmol/L (ref 3.5–5.2)
SODIUM: 142 mmol/L (ref 134–144)
Total Protein: 6.4 g/dL (ref 6.0–8.5)

## 2015-02-06 LAB — CBC WITH DIFFERENTIAL/PLATELET
Basophils Absolute: 0 10*3/uL (ref 0.0–0.2)
Basos: 1 %
EOS (ABSOLUTE): 0.4 10*3/uL (ref 0.0–0.4)
Eos: 6 %
HEMATOCRIT: 40.6 % (ref 34.0–46.6)
HEMOGLOBIN: 14.1 g/dL (ref 11.1–15.9)
Immature Grans (Abs): 0 10*3/uL (ref 0.0–0.1)
Immature Granulocytes: 0 %
LYMPHS ABS: 1.8 10*3/uL (ref 0.7–3.1)
Lymphs: 24 %
MCH: 31 pg (ref 26.6–33.0)
MCHC: 34.7 g/dL (ref 31.5–35.7)
MCV: 89 fL (ref 79–97)
MONOCYTES: 7 %
Monocytes Absolute: 0.5 10*3/uL (ref 0.1–0.9)
NEUTROS ABS: 4.8 10*3/uL (ref 1.4–7.0)
Neutrophils: 62 %
Platelets: 291 10*3/uL (ref 150–379)
RBC: 4.55 x10E6/uL (ref 3.77–5.28)
RDW: 13.5 % (ref 12.3–15.4)
WBC: 7.7 10*3/uL (ref 3.4–10.8)

## 2015-02-06 LAB — TOXASSURE SELECT 13 (MW), URINE: PDF: 0

## 2015-02-06 LAB — VITAMIN D 25 HYDROXY (VIT D DEFICIENCY, FRACTURES): Vit D, 25-Hydroxy: 7.6 ng/mL — ABNORMAL LOW (ref 30.0–100.0)

## 2015-02-06 LAB — B12 AND FOLATE PANEL
FOLATE: 8.5 ng/mL (ref >3.0)
Vitamin B-12: 252 pg/mL (ref 211–946)

## 2015-02-08 ENCOUNTER — Other Ambulatory Visit (INDEPENDENT_AMBULATORY_CARE_PROVIDER_SITE_OTHER): Payer: BLUE CROSS/BLUE SHIELD

## 2015-02-08 ENCOUNTER — Ambulatory Visit (INDEPENDENT_AMBULATORY_CARE_PROVIDER_SITE_OTHER): Payer: BLUE CROSS/BLUE SHIELD | Admitting: Internal Medicine

## 2015-02-08 ENCOUNTER — Encounter: Payer: Self-pay | Admitting: Internal Medicine

## 2015-02-08 VITALS — BP 124/80 | HR 80 | Ht 65.0 in | Wt 164.2 lb

## 2015-02-08 DIAGNOSIS — Z800 Family history of malignant neoplasm of digestive organs: Secondary | ICD-10-CM

## 2015-02-08 DIAGNOSIS — K219 Gastro-esophageal reflux disease without esophagitis: Secondary | ICD-10-CM

## 2015-02-08 DIAGNOSIS — R5383 Other fatigue: Secondary | ICD-10-CM

## 2015-02-08 LAB — IBC PANEL
IRON: 121 ug/dL (ref 42–145)
SATURATION RATIOS: 29.9 % (ref 20.0–50.0)
Transferrin: 289 mg/dL (ref 212.0–360.0)

## 2015-02-08 LAB — IGA: IgA: 230 mg/dL (ref 68–378)

## 2015-02-08 LAB — TSH: TSH: 2.1 u[IU]/mL (ref 0.35–4.50)

## 2015-02-08 LAB — FERRITIN: FERRITIN: 42.8 ng/mL (ref 10.0–291.0)

## 2015-02-08 MED ORDER — NA SULFATE-K SULFATE-MG SULF 17.5-3.13-1.6 GM/177ML PO SOLN: ORAL | Status: DC

## 2015-02-08 NOTE — Patient Instructions (Signed)
You have been scheduled for an endoscopy and colonoscopy. Please follow the written instructions given to you at your visit today. Please pick up your prep supplies at the pharmacy within the next 1-3 days. If you use inhalers (even only as needed), please bring them with you on the day of your procedure. Your physician has requested that you go to www.startemmi.com and enter the access code given to you at your visit today. This web site gives a general overview about your procedure. However, you should still follow specific instructions given to you by our office regarding your preparation for the procedure.  Your physician has requested that you go to the basement for the following lab work before leaving today: TSH, Celiac, IBC, Ferritin

## 2015-02-08 NOTE — Progress Notes (Signed)
Patient ID: Heather [name], female   DOB: [birth year], 50 y.o.   MRN: [id number] HPI: Heather [name] is a 50 year old female with past medical history tobacco abuse who seen in consultation at the request of Dr. Evette Doffing to consider colorectal cancer screening based on a family history of colon cancer in the patient's mother and also discuss heartburn. She is here today with 2 adult family members. She reports that lately she has been feeling fatigue on a daily basis. Low energy. Denies issues with sleep. Also noticed easy bruising but no gingival bleeding no blood in stool or melena. She reports loose stools which occur after eating and this is been a lifelong issue. She denies abdominal pain. No previous abdominal surgeries. Denies constipation. No change in bowel habit. She does have frequent heartburn occurring at least daily but denies dysphagia or odynophagia. Reports good appetite without nausea or vomiting.  There is discrepancy as to when her mother had colon cancer. She states her mother was in her 61s when she had colon cancer but previous chart documentation suggested late 74s.  The patient is never had upper endoscopy or colonoscopy.  On recent PCP visit she was found to have severe vitamin D deficiency and started on high-dose supplementation. She's also being treated for UTI and was started on Celexa.  History reviewed. No pertinent past medical history.  Past Surgical History  Procedure Laterality Date  . External ear surgery    . Skin graft      Outpatient Prescriptions Prior to Visit  Medication Sig Dispense Refill  . citalopram (CELEXA) 20 MG tablet Take 1 tablet (20 mg total) by mouth daily. 30 tablet 3  . nitrofurantoin, macrocrystal-monohydrate, (MACROBID) 100 MG capsule Take 1 capsule (100 mg total) by mouth 2 (two) times daily. 1 po BId 14 capsule 0  . Vitamin D, Ergocalciferol, (DRISDOL) 50000 units CAPS capsule Take 1 capsule (50,000 Units total) by mouth every 7 (seven)  days. 12 capsule 0   No facility-administered medications prior to visit.    Allergies  Allergen Reactions  . Aleve [Naproxen]     Hives and itching  . Excedrin [Aspirin-Acetaminophen-Caffeine] Hives  . Vicodin [Hydrocodone-Acetaminophen] Hives    Family History  Problem Relation Age of Onset  . Heart failure Mother   . Colon cancer Mother 46  . Heart disease Father   . Breast cancer Sister   . Heart disease Maternal Aunt   . Heart disease Maternal Uncle   . Colon cancer Paternal Aunt 12  . Bladder Cancer Mother     Social History  Substance Use Topics  . Smoking status: Current Every Day Smoker -- 0.50 packs/day    Types: Cigarettes  . Smokeless tobacco: Never Used     Comment: 02-08-2015 info given  . Alcohol Use: No    ROS: As per history of present illness, otherwise negative  BP 124/80 mmHg  Pulse 80  Ht 5\' 5"  (1.651 m)  Wt 164 lb 4 oz (74.503 kg)  BMI 27.33 kg/m2 Constitutional: Well-developed and well-nourished. No distress. HEENT: Poor dentition. Normocephalic and atraumatic. Oropharynx is clear and moist. No oropharyngeal exudate. Conjunctivae are normal.  No scleral icterus. Neck: Neck supple. Trachea midline. Cardiovascular: Normal rate, regular rhythm and intact distal pulses. No M/R/G Pulmonary/chest: Effort normal and breath sounds normal. No wheezing, rales or rhonchi. Abdominal: Soft, nontender, nondistended. Bowel sounds active throughout Extremities: no clubbing, cyanosis, trace pretibial edema Neurological: Alert and oriented to person place and time. Skin: Skin is warm  and dry.  Psychiatric: Normal mood and affect. Behavior is normal.  RELEVANT LABS AND IMAGING: CBC    Component Value Date/Time   WBC 7.7 02/01/2015 1636   WBC 9.4 02/07/2014 1223   RBC 4.55 02/01/2015 1636   RBC 4.34 02/07/2014 1223   HGB 13.3 02/07/2014 1223   HCT 40.6 02/01/2015 1636   HCT 38.7 02/07/2014 1223   PLT 291 02/01/2015 1636   PLT 233 02/07/2014 1223    MCV 89 02/01/2015 1636   MCV 89.2 02/07/2014 1223   MCH 31.0 02/01/2015 1636   MCH 30.6 02/07/2014 1223   MCHC 34.7 02/01/2015 1636   MCHC 34.4 02/07/2014 1223   RDW 13.5 02/01/2015 1636   RDW 13.5 02/07/2014 1223   LYMPHSABS 1.8 02/01/2015 1636   LYMPHSABS 1.9 02/07/2014 1223   MONOABS 0.5 02/07/2014 1223   EOSABS 0.4 02/01/2015 1636   EOSABS 0.3 02/07/2014 1223   BASOSABS 0.0 02/01/2015 1636   BASOSABS 0.0 02/07/2014 1223    CMP     Component Value Date/Time   NA 142 02/01/2015 1636   NA 138 02/07/2014 1223   K 4.3 02/01/2015 1636   CL 102 02/01/2015 1636   CO2 23 02/01/2015 1636   GLUCOSE 98 02/01/2015 1636   GLUCOSE 87 02/07/2014 1223   BUN 11 02/01/2015 1636   BUN 11 02/07/2014 1223   CREATININE 0.73 02/01/2015 1636   CALCIUM 9.1 02/01/2015 1636   PROT 6.4 02/01/2015 1636   ALBUMIN 4.1 02/01/2015 1636   AST 16 02/01/2015 1636   ALT 10 02/01/2015 1636   ALKPHOS 73 02/01/2015 1636   BILITOT 0.3 02/01/2015 1636   GFRNONAA 98 02/01/2015 1636   GFRAA 113 02/01/2015 1636    ASSESSMENT/PLAN: 50 year old female with past medical history tobacco abuse who seen in consultation at the request of Dr. Evette Doffing to consider colorectal cancer screening based on a family history of colon cancer in the patient's mother and also discuss heartburn.  1. Frequent heartburn -- begin pantoprazole 40 mg daily. Upper endoscopy recommended. We discussed the risk benefits and alternatives and she is agreeable to proceed.  2. Family history of colon cancer -- screening colonoscopy recommended. We discussed the risks, benefits and alternatives and she is agreeable to proceed  3. Fatigue -- recently normal B12 and folate. Vitamin D very low and being replaced. Check iron studies and celiac panel, the latter in light of postprandial loose stools. Reassuringly there is no anemia to this point.    [id number] [address], [address], [place] [address]

## 2015-02-09 LAB — TISSUE TRANSGLUTAMINASE, IGA: Tissue Transglutaminase Ab, IgA: 1 U/mL (ref <4)

## 2015-02-14 ENCOUNTER — Telehealth: Payer: Self-pay | Admitting: Internal Medicine

## 2015-02-14 MED ORDER — PANTOPRAZOLE SODIUM 40 MG PO TBEC: 40.0000 mg | DELAYED_RELEASE_TABLET | Freq: Every day | ORAL | Status: DC

## 2015-02-14 NOTE — Telephone Encounter (Signed)
Pt states she is having bad heartburn and needs something to help. Per OV note Dr. Hilarie Fredrickson wanted her to start protonix 40mg  daily. Script sent to pharmacy, pt aware.

## 2015-03-01 ENCOUNTER — Ambulatory Visit (AMBULATORY_SURGERY_CENTER): Payer: BLUE CROSS/BLUE SHIELD | Admitting: Gastroenterology

## 2015-03-01 ENCOUNTER — Encounter: Payer: Self-pay | Admitting: Gastroenterology

## 2015-03-01 ENCOUNTER — Encounter: Payer: BLUE CROSS/BLUE SHIELD | Admitting: Internal Medicine

## 2015-03-01 VITALS — BP 107/62 | HR 66 | Temp 100.4°F | Resp 15 | Ht 65.0 in | Wt 164.0 lb

## 2015-03-01 DIAGNOSIS — K2900 Acute gastritis without bleeding: Secondary | ICD-10-CM | POA: Diagnosis not present

## 2015-03-01 DIAGNOSIS — K219 Gastro-esophageal reflux disease without esophagitis: Secondary | ICD-10-CM

## 2015-03-01 DIAGNOSIS — R1033 Periumbilical pain: Secondary | ICD-10-CM | POA: Diagnosis not present

## 2015-03-01 DIAGNOSIS — K2960 Other gastritis without bleeding: Secondary | ICD-10-CM

## 2015-03-01 DIAGNOSIS — K2950 Unspecified chronic gastritis without bleeding: Secondary | ICD-10-CM | POA: Diagnosis not present

## 2015-03-01 MED ORDER — SODIUM CHLORIDE 0.9 % IV SOLN: 500.0000 mL | INTRAVENOUS | Status: DC

## 2015-03-01 NOTE — Progress Notes (Signed)
PATIENT STATING HER LAST SOLID MEAL WAS LAST EVENING AT 2130. SHE HAD PIZZA. PATIENT STATING SHE COMPLETED HER PREP WITH EXCEPTION OF LAST ONE THIRD.  HER LAST BOWEL MOVEMENT WAS SOLID BROWN STOOL.  PATIENT WITH TEMP. OF 100.4, PATIENT DENIES SYMPTOMS OF ANY TYPE OF INFECTIOUS PROCESS. REPORT GIVEN TO DR. Fuller Plan. PATIENT GIVEN OPTION OF HAVING EGD TODAY AND RESCHEDULING HER COLONOSCOPY WITH DR. PYRTLE. PATIENT CHOOSING TO HAVE EGD TODAY. NOTIFIED RECOVERY OF NEED FOR RESCHEDULE OF COLONOSCOPY.

## 2015-03-01 NOTE — Op Note (Signed)
[place]  [address] [place], [id number]   ENDOSCOPY PROCEDURE REPORT  PATIENT: Heather [name], Heather [name]  MR#: [id number] BIRTHDATE: Mar 26, 1965 , 50  yrs. old GENDER: female ENDOSCOPIST: Ladene Artist, MD, [facility] REFERRED BY:  [name] PROCEDURE DATE:  03/01/2015 PROCEDURE:  EGD w/ biopsy ASA CLASS:     Class III INDICATIONS:  history of esophageal reflux and periumbilical abdominal pain. MEDICATIONS: Monitored anesthesia care and Propofol 270 mg IV TOPICAL ANESTHETIC: none DESCRIPTION OF PROCEDURE: After the risks benefits and alternatives of the procedure were thoroughly explained, informed consent was obtained.  The    endoscope was introduced through the mouth and advanced to the second portion of the duodenum , Without limitations.  The instrument was slowly withdrawn as the mucosa was fully examined.    STOMACH: Moderate erosive gastritis was found in the prepyloric region of the stomach and gastric antrum.  Cold forcep biopsies were taken at the gastric body and antrum  to evaluate for histology/h. pylori.   The stomach otherwise appeared normal. ESOPHAGUS: The mucosa of the esophagus appeared normal. DUODENUM: The duodenal mucosa showed no abnormalities in the bulb and 2nd part of the duodenum.  Retroflexed views revealed no abnormalities.   The scope was then withdrawn from the patient and the procedure completed.  COMPLICATIONS: There were no immediate complications.  ENDOSCOPIC IMPRESSION: 1.   Erosive gastritis in the prepyloric region of the stomach and gastric antrum; biopsies obtained 2.   The EGD otherwise appeared normal  RECOMMENDATIONS: 1.  Anti-reflux regimen 2.  Await pathology results 3.  Avoid/minimize ASA/NSAIDS 4.  Continue PPI qam 5.  Schedule colonoscopy with Dr. Hilarie Fredrickson with a 2 day bowel prep  eSigned:  Ladene Artist, MD, [facility] 03/01/2015 2:54 PM

## 2015-03-01 NOTE — Progress Notes (Signed)
Called to room to assist during endoscopic procedure.  Patient ID and intended procedure confirmed with present staff. Received instructions for my participation in the procedure from the performing physician.  

## 2015-03-01 NOTE — Progress Notes (Signed)
To pacu vss patent aw report to rn 

## 2015-03-01 NOTE — Patient Instructions (Addendum)
YOU HAD AN ENDOSCOPIC PROCEDURE TODAY AT [facility]:   Refer to the procedure report that was given to you for any specific questions about what was found during the examination.  If the procedure report does not answer your questions, please call your gastroenterologist to clarify.  If you requested that your care partner not be given the details of your procedure findings, then the procedure report has been included in a sealed envelope for you to review at your convenience later.  YOU SHOULD EXPECT: Some feelings of bloating in the abdomen. Passage of more gas than usual.  Walking can help get rid of the air that was put into your GI tract during the procedure and reduce the bloating. If you had a lower endoscopy (such as a colonoscopy or flexible sigmoidoscopy) you may notice spotting of blood in your stool or on the toilet paper. If you underwent a bowel prep for your procedure, you may not have a normal bowel movement for a few days.  Please Note:  You might notice some irritation and congestion in your nose or some drainage.  This is from the oxygen used during your procedure.  There is no need for concern and it should clear up in a day or so.  SYMPTOMS TO REPORT IMMEDIATELY:   Following upper endoscopy (EGD)  Vomiting of blood or coffee ground material  New chest pain or pain under the shoulder blades  Painful or persistently difficult swallowing  New shortness of breath  Fever of 100F or higher  Black, tarry-looking stools  For urgent or emergent issues, a gastroenterologist can be reached at any hour by calling [phone].   DIET: Your first meal following the procedure should be a small meal and then it is ok to progress to your normal diet. Heavy or fried foods are harder to digest and may make you feel nauseous or bloated.  Likewise, meals heavy in dairy and vegetables can increase bloating.  Drink plenty of fluids but you should avoid alcoholic beverages for  24 hours.  ACTIVITY:  You should plan to take it easy for the rest of today and you should NOT DRIVE or use heavy machinery until tomorrow (because of the sedation medicines used during the test).    FOLLOW UP: Our staff will call the number listed on your records the next business day following your procedure to check on you and address any questions or concerns that you may have regarding the information given to you following your procedure. If we do not reach you, we will leave a message.  However, if you are feeling well and you are not experiencing any problems, there is no need to return our call.  We will assume that you have returned to your regular daily activities without incident.  If any biopsies were taken you will be contacted by phone or by letter within the next 1-3 weeks.  Please call us at [phone] if you have not heard about the biopsies in 3 weeks.    SIGNATURES/CONFIDENTIALITY: You and/or your care partner have signed paperwork which will be entered into your electronic medical record.  These signatures attest to the fact that that the information above on your After Visit Summary has been reviewed and is understood.  Full responsibility of the confidentiality of this discharge information lies with you and/or your care-partner.  Gastritis-handout given  Wait pathology.  Avoid NSAIDs (ibuprofen, motrin, advil, aleve, naproxen, mobic,etc)  Continue protonix every morning.

## 2015-03-02 ENCOUNTER — Telehealth: Payer: Self-pay

## 2015-03-02 NOTE — Telephone Encounter (Signed)
  Follow up Call-  Call back number 03/01/2015  Post procedure Call Back phone  # [phone]  Permission to leave phone message Yes     Patient was called for follow up after procedure on 03/01/2015. No answer at the number given for follow up phone call. A message was left on the answering machine.

## 2015-03-07 ENCOUNTER — Encounter: Payer: Self-pay | Admitting: Gastroenterology

## 2015-03-08 ENCOUNTER — Ambulatory Visit: Payer: BLUE CROSS/BLUE SHIELD | Admitting: Pediatrics

## 2015-03-12 ENCOUNTER — Ambulatory Visit: Payer: BLUE CROSS/BLUE SHIELD | Admitting: Pediatrics

## 2015-03-13 ENCOUNTER — Encounter: Payer: Self-pay | Admitting: Pediatrics

## 2015-03-15 ENCOUNTER — Ambulatory Visit (AMBULATORY_SURGERY_CENTER): Payer: Self-pay

## 2015-03-15 ENCOUNTER — Encounter: Payer: BLUE CROSS/BLUE SHIELD | Admitting: Internal Medicine

## 2015-03-15 VITALS — Ht 61.0 in | Wt 168.0 lb

## 2015-03-15 DIAGNOSIS — Z800 Family history of malignant neoplasm of digestive organs: Secondary | ICD-10-CM

## 2015-03-15 MED ORDER — NA SULFATE-K SULFATE-MG SULF 17.5-3.13-1.6 GM/177ML PO SOLN: 1.0000 | Freq: Once | ORAL | Status: DC

## 2015-03-15 NOTE — Progress Notes (Signed)
No egg or soy allergies Not on home 02 No previous anesthesia complications Emmi video declined. No diet or weight loss meds 

## 2015-03-16 ENCOUNTER — Ambulatory Visit (INDEPENDENT_AMBULATORY_CARE_PROVIDER_SITE_OTHER): Payer: BLUE CROSS/BLUE SHIELD | Admitting: Pediatrics

## 2015-03-16 ENCOUNTER — Encounter: Payer: Self-pay | Admitting: Pediatrics

## 2015-03-16 VITALS — BP 118/80 | HR 74 | Temp 97.3°F | Ht 61.0 in | Wt 168.0 lb

## 2015-03-16 DIAGNOSIS — F411 Generalized anxiety disorder: Secondary | ICD-10-CM | POA: Diagnosis not present

## 2015-03-16 DIAGNOSIS — R200 Anesthesia of skin: Secondary | ICD-10-CM | POA: Diagnosis not present

## 2015-03-16 DIAGNOSIS — K219 Gastro-esophageal reflux disease without esophagitis: Secondary | ICD-10-CM

## 2015-03-16 DIAGNOSIS — F17200 Nicotine dependence, unspecified, uncomplicated: Secondary | ICD-10-CM | POA: Diagnosis not present

## 2015-03-16 MED ORDER — ESCITALOPRAM OXALATE 5 MG PO TABS: 5.0000 mg | ORAL_TABLET | Freq: Every day | ORAL | Status: DC

## 2015-03-16 NOTE — Progress Notes (Signed)
    Subjective:    Patient ID: Heather [name], female    DOB: [birth year], 50 y.o.   MRN: [id number]  CC: Follow-up multiple med problems  HPI: Heather [name] is a 50 y.o. female presenting for Follow-up  GER: given list of foods to avoid Found to have gastritis on recent EGD, H. Pylori negative  Anxiety: citalopram made her sleepy Did help with her anxiety Was taking it in the morning Wants to switch to something else, doesn't want to try taking it at night  Tobacco: continuing to work on tobacco cesssation  Numbness: no further numbness in legs, has resolved. Sometimes LUE she will have a similar feeling  Bruising: has resolved  Has f/u appt with GI for colonoscopy. Found to have gastritis on EGD. Still with some symptoms despite pantoprazole.  Depression screen [facility] 2/9 03/16/2015 02/01/2015 12/06/2014  Decreased Interest 0 0 0  Down, Depressed, Hopeless 0 0 0  PHQ - 2 Score 0 0 0     Relevant past medical, surgical, family and social history reviewed and updated as indicated. Interim medical history since our last visit reviewed. Allergies and medications reviewed and updated.    ROS: Per HPI unless specifically indicated above  History  Smoking status  . Current Every Day Smoker -- 0.50 packs/day  . Types: Cigarettes  Smokeless tobacco  . Never Used    Comment: 02-08-2015 info given    Past Medical History Patient Active Problem List   Diagnosis Date Noted  . Esophageal reflux 03/16/2015  . Vitamin D deficiency 02/03/2015  . Generalized anxiety disorder 02/03/2015  . Numbness 02/03/2015      Objective:    BP 118/80 mmHg  Pulse 74  Temp(Src) 97.3 F (36.3 C) (Oral)  Ht 5\' 1"  (1.549 m)  Wt 168 lb (76.204 kg)  BMI 31.76 kg/m2  LMP 02/07/2015  Wt Readings from Last 3 Encounters:  03/16/15 168 lb (76.204 kg)  03/15/15 168 lb (76.204 kg)  03/01/15 164 lb (74.39 kg)     Gen: NAD, alert, cooperative with exam, NCAT EYES: EOMI, no scleral injection or  icterus ENT:  TMs pearly gray b/l, OP without erythema LYMPH: no cervical LAD CV: NRRR, normal S1/S2, no murmur, distal pulses 2+ b/l Resp: CTABL, no wheezes, normal WOB Abd: +BS, soft, NTND. no guarding or organomegaly Ext: No edema, warm Neuro: Alert and oriented, strength equal b/l UE including hand grip, ext/flex at shoulder, elbow. Sensation "different" L hand and LUE compared with R. MSK: normal ROM neck, no point tenderness over neck     Assessment & Plan:    [name] was seen today for follow-up multiple med problems.  Diagnoses and all orders for this visit:  Generalized anxiety disorder Stop citalopram. Start lexapro, take at night. -     escitalopram (LEXAPRO) 5 MG tablet; Take 1 tablet (5 mg total) by mouth daily.  Numbness comes and goes, not clear etiology, not able to delineate to dermatomes on exam. No weakness, no neck injury, pain or tenderness. Let me know if worsens. Consider imaging if does not resolve.  Gastroesophageal reflux disease, esophagitis presence not specified Gave list of foods to avoid with gastritis. Cont PPI.  Tobacco use disorder Cont to work on cessation  Follow up plan: Return in about 5 months (around 08/16/2015) for follow up, soonner if needed.  [name], MD [facility] 03/16/2015

## 2015-03-17 DIAGNOSIS — F17200 Nicotine dependence, unspecified, uncomplicated: Secondary | ICD-10-CM | POA: Insufficient documentation

## 2015-03-18 DIAGNOSIS — F17210 Nicotine dependence, cigarettes, uncomplicated: Secondary | ICD-10-CM | POA: Diagnosis not present

## 2015-03-18 DIAGNOSIS — R200 Anesthesia of skin: Secondary | ICD-10-CM | POA: Insufficient documentation

## 2015-03-18 DIAGNOSIS — R079 Chest pain, unspecified: Secondary | ICD-10-CM | POA: Diagnosis present

## 2015-03-19 ENCOUNTER — Emergency Department (HOSPITAL_COMMUNITY): Payer: BLUE CROSS/BLUE SHIELD

## 2015-03-19 ENCOUNTER — Emergency Department (HOSPITAL_COMMUNITY)
Admission: EM | Admit: 2015-03-19 | Discharge: 2015-03-19 | Disposition: A | Discharge status: Home / Self Care | Payer: BLUE CROSS/BLUE SHIELD | Attending: Emergency Medicine | Admitting: Emergency Medicine

## 2015-03-19 ENCOUNTER — Encounter (HOSPITAL_COMMUNITY): Payer: Self-pay | Admitting: Emergency Medicine

## 2015-03-19 DIAGNOSIS — R200 Anesthesia of skin: Secondary | ICD-10-CM

## 2015-03-19 LAB — CBC
HEMATOCRIT: 36.1 % (ref 36.0–46.0)
HEMOGLOBIN: 12.7 g/dL (ref 12.0–15.0)
MCH: 31.2 pg (ref 26.0–34.0)
MCHC: 35.2 g/dL (ref 30.0–36.0)
MCV: 88.7 fL (ref 78.0–100.0)
PLATELETS: 285 10*3/uL (ref 150–400)
RBC: 4.07 MIL/uL (ref 3.87–5.11)
RDW: 13.7 % (ref 11.5–15.5)
WBC: 7.5 10*3/uL (ref 4.0–10.5)

## 2015-03-19 LAB — DIFFERENTIAL
BASOS PCT: 0 %
Basophils Absolute: 0 10*3/uL (ref 0.0–0.1)
EOS ABS: 0.3 10*3/uL (ref 0.0–0.7)
EOS PCT: 4 %
Lymphocytes Relative: 32 %
Lymphs Abs: 2.3 10*3/uL (ref 0.7–4.0)
MONO ABS: 0.4 10*3/uL (ref 0.1–1.0)
MONOS PCT: 5 %
NEUTROS ABS: 4.3 10*3/uL (ref 1.7–7.7)
Neutrophils Relative %: 59 %

## 2015-03-19 LAB — COMPREHENSIVE METABOLIC PANEL
ALT: 11 U/L — AB (ref 14–54)
ANION GAP: 6 (ref 5–15)
AST: 14 U/L — ABNORMAL LOW (ref 15–41)
Albumin: 3.6 g/dL (ref 3.5–5.0)
Alkaline Phosphatase: 57 U/L (ref 38–126)
BUN: 11 mg/dL (ref 6–20)
CHLORIDE: 107 mmol/L (ref 101–111)
CO2: 25 mmol/L (ref 22–32)
CREATININE: 0.8 mg/dL (ref 0.44–1.00)
Calcium: 8.4 mg/dL — ABNORMAL LOW (ref 8.9–10.3)
Glucose, Bld: 97 mg/dL (ref 65–99)
Potassium: 3.5 mmol/L (ref 3.5–5.1)
SODIUM: 138 mmol/L (ref 135–145)
Total Bilirubin: 0.6 mg/dL (ref 0.3–1.2)
Total Protein: 6.3 g/dL — ABNORMAL LOW (ref 6.5–8.1)

## 2015-03-19 LAB — URINALYSIS, ROUTINE W REFLEX MICROSCOPIC
BILIRUBIN URINE: NEGATIVE
Glucose, UA: NEGATIVE mg/dL
Hgb urine dipstick: NEGATIVE
Ketones, ur: NEGATIVE mg/dL
NITRITE: NEGATIVE
PROTEIN: NEGATIVE mg/dL
SPECIFIC GRAVITY, URINE: 1.025 (ref 1.005–1.030)
pH: 5.5 (ref 5.0–8.0)

## 2015-03-19 LAB — URINE MICROSCOPIC-ADD ON

## 2015-03-19 LAB — RAPID URINE DRUG SCREEN, HOSP PERFORMED
Amphetamines: NOT DETECTED
BENZODIAZEPINES: NOT DETECTED
Barbiturates: NOT DETECTED
COCAINE: NOT DETECTED
OPIATES: NOT DETECTED
TETRAHYDROCANNABINOL: NOT DETECTED

## 2015-03-19 LAB — TROPONIN I

## 2015-03-19 LAB — PROTIME-INR
INR: 0.93 (ref 0.00–1.49)
PROTHROMBIN TIME: 12.7 s (ref 11.6–15.2)

## 2015-03-19 LAB — APTT: APTT: 29 s (ref 24–37)

## 2015-03-19 NOTE — ED Provider Notes (Signed)
CSN: [id number]     Arrival date & time 03/18/15  2353 History  By signing my name below, I, Rowan Blase, attest that this documentation has been prepared under the direction and in the presence of Rolland Porter, MD at [facility] . Electronically Signed: Rowan Blase, Scribe. 03/19/2015. 12:43 AM.  Chief Complaint  Patient presents with  . Chest Pain   The history is provided by the patient. No language interpreter was used.   HPI Comments:  MARYONNA KANAAN is a 50 y.o. female with PMHx of anxiety, GERD, and emphysema who presents to the Emergency Department via EMS complaining of sharp, constant chest pain onset 2230 tonight with associated SOB and nausea. Pt notes previous episode of burning chest pain 5 days ago for ~ 5 minutes with associated SOB, diaphoresis, dizziness, and nausea. Pt reports associated left-side numbness from shoulder into fingers and left leg onset ~ 6 days ago. She states she is staggering when trying to get up to walk and struggling to pick things up with her left hand. Pt also notes blurred vision. Pt is right handed. FHx of stroke and bypass suguries in mother at 3. Mother died at age 34 from colon cancer. She states all of her maternal aunts and uncles have heart problems. She is current 0.5 pack /day smoker. Pt is scheduled for colonoscopy on March 23 on suspicion of colon cancer due to non-bloody diarrhea. Pt denies facial numbness, headache, aphasia, vomiting, previous chest pain or numbness, or ETOH use.   PCP - Evette Doffing at [employer]  Past Medical History  Diagnosis Date  . Anxiety   . Emphysema of lung ([place])   . GERD (gastroesophageal reflux disease)    Past Surgical History  Procedure Laterality Date  . External ear surgery    . Skin graft     Family History  Problem Relation Age of Onset  . Heart failure Mother   . Colon cancer Mother 26  . Bladder Cancer Mother   . Heart disease Father   . Breast cancer Sister   . Heart disease Maternal  Aunt   . Heart disease Maternal Uncle   . Colon cancer Paternal Aunt 26   Social History  Substance Use Topics  . Smoking status: Current Every Day Smoker -- 1.00 packs/day    Types: Cigarettes  . Smokeless tobacco: Never Used     Comment: 02-08-2015 info given  . Alcohol Use: No   employed  OB History    No data available     Review of Systems  Constitutional: Positive for diaphoresis.  Eyes: Positive for visual disturbance.  Respiratory: Positive for shortness of breath.   Cardiovascular: Positive for chest pain.  Gastrointestinal: Positive for nausea. Negative for vomiting.  Neurological: Positive for numbness. Negative for facial asymmetry, speech difficulty and headaches.  All other systems reviewed and are negative.  Allergies  Aleve; Asa; Excedrin; and Vicodin  Home Medications   Prior to Admission medications   Medication Sig Start Date End Date Taking? Authorizing Provider  escitalopram (LEXAPRO) 5 MG tablet Take 1 tablet (5 mg total) by mouth daily. 03/16/15   Eustaquio Maize, MD  pantoprazole (PROTONIX) 40 MG tablet Take 1 tablet (40 mg total) by mouth daily. 02/14/15   Jerene Bears, MD   BP 126/71 mmHg  Pulse 72  Temp(Src) 97.8 F (36.6 C) (Oral)  Resp 19  Ht 5\' 1"  (1.549 m)  Wt 168 lb (76.204 kg)  BMI 31.76 kg/m2  SpO2 100%  LMP 03/07/2015  Vital signs normal   Physical Exam  Constitutional: She is oriented to person, place, and time. She appears well-developed and well-nourished.  Non-toxic appearance. She does not appear ill. No distress.  HENT:  Head: Normocephalic and atraumatic.  Right Ear: External ear normal.  Left Ear: External ear normal.  Nose: Nose normal. No mucosal edema or rhinorrhea.  Mouth/Throat: Oropharynx is clear and moist and mucous membranes are normal. No dental abscesses or uvula swelling.  Poor dentition  Eyes: Conjunctivae and EOM are normal. Pupils are equal, round, and reactive to light.  Neck: Normal range of motion and  full passive range of motion without pain. Neck supple.  Cardiovascular: Normal rate, regular rhythm and normal heart sounds.  Exam reveals no gallop and no friction rub.   No murmur heard. Pulmonary/Chest: Effort normal and breath sounds normal. No respiratory distress. She has no wheezes. She has no rhonchi. She has no rales. She exhibits no tenderness and no crepitus.  Abdominal: Soft. Normal appearance and bowel sounds are normal. She exhibits no distension. There is no tenderness. There is no rebound and no guarding.  Musculoskeletal: Normal range of motion. She exhibits no edema or tenderness.  LE has difficulty holding left leg up against gravity.  Neurological: She is alert and oriented to person, place, and time. She has normal strength. No cranial nerve deficit.  Pt does not have pronator drift but has difficulty raising left arm to 90 degrees against gravity due to left shoulder pain. Finger to nose is coordinated BL.  Skin: Skin is warm, dry and intact. No rash noted. No erythema. No pallor.  Psychiatric: She has a normal mood and affect. Her speech is normal and behavior is normal. Her mood appears not anxious.  Nursing note and vitals reviewed.   ED Course  Procedures  Medications - No data to display   DIAGNOSTIC STUDIES:  Oxygen Saturation is 100% on RA, normal by my interpretation.    COORDINATION OF CARE:  12:37 AM Will order chest x-ray, head CT, and blood work. Discussed treatment plan with pt at bedside and pt agreed to plan.  Review of patient's primary care provider's notes document she has been complaining of numbness in her left leg off and on since January. They also document patient is under a lot of stress and they have been changing her medications. Nurse documents although patient complains of weakness and numbness in her left hand she was able to use her left hand and doing her toilet duties and dressing herself and ambulated without difficulty. At this point  is for patient to be discharged home and follow up with her PCP.  Results for orders placed or performed during the hospital encounter of 03/19/15  CBC  Result Value Ref Range   WBC 7.5 4.0 - 10.5 K/uL   RBC 4.07 3.87 - 5.11 MIL/uL   Hemoglobin 12.7 12.0 - 15.0 g/dL   HCT 36.1 36.0 - 46.0 %   MCV 88.7 78.0 - 100.0 fL   MCH 31.2 26.0 - 34.0 pg   MCHC 35.2 30.0 - 36.0 g/dL   RDW 13.7 11.5 - 15.5 %   Platelets 285 150 - 400 K/uL  Protime-INR  Result Value Ref Range   Prothrombin Time 12.7 11.6 - 15.2 seconds   INR 0.93 0.00 - 1.49  APTT  Result Value Ref Range   aPTT 29 24 - 37 seconds  Differential  Result Value Ref Range   Neutrophils Relative % 59 %  Neutro Abs 4.3 1.7 - 7.7 K/uL   Lymphocytes Relative 32 %   Lymphs Abs 2.3 0.7 - 4.0 K/uL   Monocytes Relative 5 %   Monocytes Absolute 0.4 0.1 - 1.0 K/uL   Eosinophils Relative 4 %   Eosinophils Absolute 0.3 0.0 - 0.7 K/uL   Basophils Relative 0 %   Basophils Absolute 0.0 0.0 - 0.1 K/uL  Comprehensive metabolic panel  Result Value Ref Range   Sodium 138 135 - 145 mmol/L   Potassium 3.5 3.5 - 5.1 mmol/L   Chloride 107 101 - 111 mmol/L   CO2 25 22 - 32 mmol/L   Glucose, Bld 97 65 - 99 mg/dL   BUN 11 6 - 20 mg/dL   Creatinine, Ser 0.80 0.44 - 1.00 mg/dL   Calcium 8.4 (L) 8.9 - 10.3 mg/dL   Total Protein 6.3 (L) 6.5 - 8.1 g/dL   Albumin 3.6 3.5 - 5.0 g/dL   AST 14 (L) 15 - 41 U/L   ALT 11 (L) 14 - 54 U/L   Alkaline Phosphatase 57 38 - 126 U/L   Total Bilirubin 0.6 0.3 - 1.2 mg/dL   GFR calc non Af Amer >60 >60 mL/min   GFR calc Af Amer >60 >60 mL/min   Anion gap 6 5 - 15  Urine rapid drug screen (hosp performed)not at [facility]  Result Value Ref Range   Opiates NONE DETECTED NONE DETECTED   Cocaine NONE DETECTED NONE DETECTED   Benzodiazepines NONE DETECTED NONE DETECTED   Amphetamines NONE DETECTED NONE DETECTED   Tetrahydrocannabinol NONE DETECTED NONE DETECTED   Barbiturates NONE DETECTED NONE DETECTED  Urinalysis,  Routine w reflex microscopic (not at [facility])  Result Value Ref Range   Color, Urine YELLOW YELLOW   APPearance CLEAR CLEAR   Specific Gravity, Urine 1.025 1.005 - 1.030   pH 5.5 5.0 - 8.0   Glucose, UA NEGATIVE NEGATIVE mg/dL   Hgb urine dipstick NEGATIVE NEGATIVE   Bilirubin Urine NEGATIVE NEGATIVE   Ketones, ur NEGATIVE NEGATIVE mg/dL   Protein, ur NEGATIVE NEGATIVE mg/dL   Nitrite NEGATIVE NEGATIVE   Leukocytes, UA SMALL (A) NEGATIVE  Troponin I  Result Value Ref Range   Troponin I <0.03 <0.031 ng/mL  Urine microscopic-add on  Result Value Ref Range   Squamous Epithelial / LPF 6-30 (A) NONE SEEN   WBC, UA 6-30 0 - 5 WBC/hpf   RBC / HPF 0-5 0 - 5 RBC/hpf   Bacteria, UA MANY (A) NONE SEEN   Dg Chest 2 View  03/19/2015  CLINICAL DATA:  Acute onset of left-sided chest pain. Left lower extremity and left upper extremity numbness. Initial encounter. EXAM: CHEST  2 VIEW COMPARISON:  Chest radiograph performed 02/07/2014 FINDINGS: The lungs are well-aerated and clear. There is no evidence of focal opacification, pleural effusion or pneumothorax. The heart is normal in size; the mediastinal contour is within normal limits. No acute osseous abnormalities are seen. IMPRESSION: No acute cardiopulmonary process seen. Electronically Signed   By: Garald Balding M.D.   On: 03/19/2015 01:52   Ct Head Wo Contrast  03/19/2015  CLINICAL DATA:  Acute onset of left-sided numbness. Initial encounter. EXAM: CT HEAD WITHOUT CONTRAST TECHNIQUE: Contiguous axial images were obtained from the base of the skull through the vertex without intravenous contrast. COMPARISON:  None. FINDINGS: There is no evidence of acute infarction, mass lesion, or intra- or extra-axial hemorrhage on CT. A focal calcification is noted at the right frontal lobe. The posterior fossa,  including the cerebellum, brainstem and fourth ventricle, is within normal limits. The third and lateral ventricles, and basal ganglia are unremarkable in  appearance. The cerebral hemispheres are symmetric in appearance, with normal gray-white differentiation. No mass effect or midline shift is seen. There is no evidence of fracture; visualized osseous structures are unremarkable in appearance. The visualized portions of the orbits are within normal limits. Mucosal thickening is noted at the left maxillary sinus and left mastoid air cells. The remaining paranasal sinuses and mastoid air cells are well-aerated. No significant soft tissue abnormalities are seen. IMPRESSION: 1. No acute intracranial pathology seen on CT. 2. Mucosal thickening at the left maxillary sinus and left mastoid air cells. Electronically Signed   By: Garald Balding M.D.   On: 03/19/2015 01:45    I have personally reviewed and evaluated these images and lab results as part of my medical decision-making.   EKG Interpretation   Date/Time:  Monday March 19 2015 00:03:14 EDT Ventricular Rate:  75 PR Interval:  164 QRS Duration: 108 QT Interval:  402 QTC Calculation: 449 R Axis:   75 Text Interpretation:  Sinus rhythm Probable lateral infarct, old No  significant change since last tracing 07 Feb 2014 Confirmed by [facility]   MD-I, Dvon Jiles ([id number]) on 03/19/2015 12:18:07 AM      MDM   Final diagnoses:  Numbness on left side   Plan discharge  Rolland Porter, MD, FACEP    I personally performed the services described in this documentation, which was scribed in my presence. The recorded information has been reviewed and considered.  Rolland Porter, MD, Barbette Or, MD 03/19/15 [phone]

## 2015-03-19 NOTE — ED Notes (Signed)
Pt still exhibiting marked weakness on left side while doing neuro checks.  However, pt transferred to and from bedside commode with very little assistance, was able to use left hand to move gown and pull tissues from box, pulled up pants by self while maintaining balance without assistance.  Steady gait to and from bed.  Informed Dr Tomi Bamberger

## 2015-03-19 NOTE — ED Notes (Signed)
PT states she was having left sided numbness and chest discomfort Tuesday.  Went to see PCP on Thursday, told she may have had light stroke and was cleared to go back to work.  Still having numbness on left side and tonight increased pain in chest.

## 2015-03-19 NOTE — Discharge Instructions (Signed)
Call [facility] office to let Dr Evette Doffing know about your ED visit today. She will determine what further evaluation you need.

## 2015-03-19 NOTE — ED Notes (Signed)
Pt states understanding of care given and follow up instructions. Pt dressed self and ambulated to husbands ER room

## 2015-03-20 ENCOUNTER — Ambulatory Visit (INDEPENDENT_AMBULATORY_CARE_PROVIDER_SITE_OTHER): Payer: BLUE CROSS/BLUE SHIELD | Admitting: Pediatrics

## 2015-03-20 ENCOUNTER — Encounter: Payer: Self-pay | Admitting: Pediatrics

## 2015-03-20 VITALS — BP 125/78 | HR 72 | Temp 97.0°F | Ht 61.0 in | Wt 169.0 lb

## 2015-03-20 DIAGNOSIS — R29818 Other symptoms and signs involving the nervous system: Secondary | ICD-10-CM

## 2015-03-20 DIAGNOSIS — G629 Polyneuropathy, unspecified: Secondary | ICD-10-CM

## 2015-03-20 NOTE — Progress Notes (Signed)
Subjective:    Patient ID: Heather [name], female    DOB: 1965-05-29, 50 y.o.   MRN: [id number]  CC: Hospitalization Follow-up   HPI: Heather [name] is a 50 y.o. female presenting for Hospitalization Follow-up  Went to work started having pain in chest  Dropping things from L hand more Now with numbness and tingling in hand and leg, both L side Feels weak L side allergic to aspirin, gets hives  Family hx: mom had first bypass heart surgery at 35yo                                                                                                                                Depression screen [facility] 2/9 03/20/2015 03/16/2015 02/01/2015 12/06/2014  Decreased Interest 0 0 0 0  Down, Depressed, Hopeless 0 0 0 0  PHQ - 2 Score 0 0 0 0     Relevant past medical, surgical, family and social history reviewed and updated as indicated. Interim medical history since our last visit reviewed. Allergies and medications reviewed and updated.    ROS: Per HPI unless specifically indicated above  History  Smoking status  . Current Every Day Smoker -- 1.00 packs/day  . Types: Cigarettes  Smokeless tobacco  . Never Used    Comment: 02-08-2015 info given    Past Medical History Patient Active Problem List   Diagnosis Date Noted  . Tobacco use disorder 03/17/2015  . Esophageal reflux 03/16/2015  . Vitamin D deficiency 02/03/2015  . Generalized anxiety disorder 02/03/2015  . Numbness 02/03/2015       Objective:    BP 125/78 mmHg  Pulse 72  Temp(Src) 97 F (36.1 C) (Oral)  Ht 5\' 1"  (1.549 m)  Wt 169 lb (76.658 kg)  BMI 31.95 kg/m2  LMP 03/07/2015  Wt Readings from Last 3 Encounters:  03/20/15 169 lb (76.658 kg)  03/19/15 168 lb (76.204 kg)  03/16/15 168 lb (76.204 kg)     Gen: NAD, alert, cooperative with exam, NCAT EYES: EOMI, no scleral injection or icterus ENT:  OP without erythema LYMPH: no cervical LAD CV: NRRR, normal S1/S2, no murmur, distal pulses 2+ b/l Resp:  CTABL, no wheezes, normal WOB Ext: No edema, warm Neuro: Alert and oriented, 4/5 strength LUE with hand grip, ext/flex at elbow, 4/5 LLE with ext/flex at knee and ankle. RUE and RLE 5/5 strength. L side of face soft touch feels like scratching, no feeling with touch LUE or LLE. EOMI, coordinated movements      Assessment & Plan:    Azelia was seen today for hospitalization follow-up, still with numbness L side of body. Allergic to aspirin.   Diagnoses and all orders for this visit:  Focal neurological deficit -     MR Brain W Wo Contrast; Future  Neuropathy (HCC) -     gabapentin (NEURONTIN) 300 MG capsule; Take 1 capsule (300 mg total) by mouth 2 (two) times  daily.  Follow up plan: Return in about 2 weeks (around 04/03/2015).  [name], MD [facility] 03/20/2015, 10:28 AM

## 2015-03-22 ENCOUNTER — Telehealth: Payer: Self-pay | Admitting: Pediatrics

## 2015-03-22 MED ORDER — GABAPENTIN 300 MG PO CAPS: 300.0000 mg | ORAL_CAPSULE | Freq: Two times a day (BID) | ORAL | Status: DC

## 2015-03-22 NOTE — Telephone Encounter (Signed)
Pt is aware.  

## 2015-03-22 NOTE — Telephone Encounter (Signed)
Sorry about the medicine, i sent it in now. Gabapentin 300mg  twice a day like we discussed.  I can see that the referral has been made for MRI, doesn't look like it has been scheduled yet. Hopefully soon.  Probably waiting for insurance approval.

## 2015-03-28 ENCOUNTER — Telehealth: Payer: Self-pay | Admitting: Internal Medicine

## 2015-03-28 NOTE — Telephone Encounter (Signed)
no

## 2015-03-29 ENCOUNTER — Encounter: Payer: BLUE CROSS/BLUE SHIELD | Admitting: Internal Medicine

## 2015-04-05 ENCOUNTER — Ambulatory Visit (INDEPENDENT_AMBULATORY_CARE_PROVIDER_SITE_OTHER): Payer: BLUE CROSS/BLUE SHIELD | Admitting: Pediatrics

## 2015-04-05 ENCOUNTER — Encounter: Payer: Self-pay | Admitting: Pediatrics

## 2015-04-05 VITALS — BP 123/83 | HR 77 | Temp 97.8°F | Ht 61.0 in | Wt 168.8 lb

## 2015-04-05 DIAGNOSIS — K219 Gastro-esophageal reflux disease without esophagitis: Secondary | ICD-10-CM | POA: Diagnosis not present

## 2015-04-05 DIAGNOSIS — G43109 Migraine with aura, not intractable, without status migrainosus: Secondary | ICD-10-CM

## 2015-04-05 DIAGNOSIS — R200 Anesthesia of skin: Secondary | ICD-10-CM | POA: Diagnosis not present

## 2015-04-05 DIAGNOSIS — R1013 Epigastric pain: Secondary | ICD-10-CM | POA: Diagnosis not present

## 2015-04-05 DIAGNOSIS — E559 Vitamin D deficiency, unspecified: Secondary | ICD-10-CM

## 2015-04-05 LAB — URINALYSIS
BILIRUBIN UA: NEGATIVE
Glucose, UA: NEGATIVE
Ketones, UA: NEGATIVE
Leukocytes, UA: NEGATIVE
NITRITE UA: NEGATIVE
PH UA: 7 (ref 5.0–7.5)
PROTEIN UA: NEGATIVE
SPEC GRAV UA: 1.015 (ref 1.005–1.030)
UUROB: 0.2 mg/dL (ref 0.2–1.0)

## 2015-04-05 LAB — URINALYSIS, MICROSCOPIC ONLY: Epithelial Cells (non renal): >10 /hpf — AB (ref 0–10)

## 2015-04-05 MED ORDER — RIZATRIPTAN BENZOATE 10 MG PO TABS
10.0000 mg | ORAL_TABLET | ORAL | Status: DC | PRN
Start: 2015-04-05 — End: 2015-04-21

## 2015-04-05 MED ORDER — ONDANSETRON HCL 4 MG PO TABS: 4.0000 mg | ORAL_TABLET | Freq: Three times a day (TID) | ORAL | Status: DC | PRN

## 2015-04-05 NOTE — Patient Instructions (Signed)
Take pantoprazole 30 minutes before meal

## 2015-04-05 NOTE — Progress Notes (Addendum)
Subjective:    Patient ID: Heather [name], female    DOB: January 17, 1965, 50 y.o.   MRN: [id number]  CC: Abdominal Pain and Nausea   HPI: Heather [name] is a 50 y.o. female presenting for Abdominal Pain and Nausea  About a week ago started having abd pain in epigastric area, moves around to everywhere Has taken tylenol Allergic to NSAIDs so doesn't take any Feeling nauseated, no vomiting, says she wants to vomit but nothing on her stomach Eating once a day No EtOH use Recent endoscopy with mod to severe gastritis, neg H. Pylori.  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 starts getting nauseated Eats one meal a day now around 3pm before work Usually eats a sandwich or a burger at 3pm Pain gets worse after she eats  Belly pain ongoin gpast two weeks, worse the last week Nothing seems to make the pain better Doesn't matter what she eats, fruit vs burger, all hurt her stomach  Stooling every day when she wakes up around 3pm and before she goes to sleep No diarrhea, solid normal stools, not dark or bloody  No history of gallbladder problems, mom had hers removed   Numbness improved on L leg Now says it comes and goes at times through the day, lasting about 20 minutes Often will start in her arm, go down to her leg then she will get a migraine No weakness   Depression screen [facility] 2/9 04/05/2015 03/20/2015 03/16/2015 02/01/2015 12/06/2014  Decreased Interest 0 0 0 0 0  Down, Depressed, Hopeless 0 0 0 0 0  PHQ - 2 Score 0 0 0 0 0     Relevant past medical, surgical, family and social history reviewed and updated as indicated. Interim medical history since our last visit reviewed. Allergies and medications reviewed and updated.    ROS: Per HPI unless specifically indicated above  History  Smoking status  . Current Every Day Smoker -- 1.00 packs/day  . Types: Cigarettes  Smokeless tobacco  . Never Used    Comment: 02-08-2015 info given    Past Medical History Patient  Active Problem List   Diagnosis Date Noted  . Tobacco use disorder 03/17/2015  . Esophageal reflux 03/16/2015  . Vitamin D deficiency 02/03/2015  . Generalized anxiety disorder 02/03/2015  . Numbness 02/03/2015    Current Outpatient Prescriptions  Medication Sig Dispense Refill  . escitalopram (LEXAPRO) 5 MG tablet Take 1 tablet (5 mg total) by mouth daily. 30 tablet 2  . ondansetron (ZOFRAN) 4 MG tablet Take 1 tablet (4 mg total) by mouth every 8 (eight) hours as needed for nausea or vomiting. 20 tablet 0  . pantoprazole (PROTONIX) 40 MG tablet Take 1 tablet (40 mg total) by mouth daily. 90 tablet 3  . rizatriptan (MAXALT) 10 MG tablet Take 1 tablet (10 mg total) by mouth as needed for migraine. May repeat in 2 hours if needed 10 tablet 0   No current facility-administered medications for this visit.       Objective:    BP 123/83 mmHg  Pulse 77  Temp(Src) 97.8 F (36.6 C) (Oral)  Ht _0  (1.549 m)  Wt 168 lb 12.8 oz (76.567 kg)  BMI 31.91 kg/m2  LMP 03/07/2015  Wt Readings from Last 3 Encounters:  04/05/15 168 lb 12.8 oz (76.567 kg)  03/20/15 169 lb (76.658 kg)  03/19/15 168 lb (76.204 kg)     Gen: NAD, alert, cooperative with exam, NCAT EYES: no scleral  injection or icterus ENT:   OP without erythema LYMPH: no cervical LAD CV: NRRR, normal S1/S2, no murmur, distal pulses 2+ b/l Resp: CTABL, no wheezes, normal WOB Abd: +BS, soft, tender with palpation throughout, especially epigastric, ND. no guarding or organomegaly, no rebound, neg murphys though tender in RUQ Ext: No edema, warm Neuro: Alert and oriented, strength equal b/l UE and LE, coordination grossly normal MSK: normal muscle bulk     Assessment & Plan:    Sherece was seen today for abdominal pain, below problems.  Diagnoses and all orders for this visit:  Abdominal pain, epigastric -     Lipase -     CMP14+EGFR -     CBC with Differential -     ondansetron (ZOFRAN) 4 MG tablet; Take 1 tablet (4 mg  total) by mouth every 8 (eight) hours as needed for nausea or vomiting. -     Urinalysis -     Urine Microscopic -     Urine culture  Migraine with aura and without status migrainosus, not intractable -     ondansetron (ZOFRAN) 4 MG tablet; Take 1 tablet (4 mg total) by mouth every 8 (eight) hours as needed for nausea or vomiting. -     rizatriptan (MAXALT) 10 MG tablet; Take 1 tablet (10 mg total) by mouth as needed for migraine. May repeat in 2 hours if needed  Numbness Possibly related to migraines. Completely resolves at times before coming back MRI denied by insurance Will continue to follow, treat migraines as above  Gastroesophageal reflux disease, esophagitis presence not specified Recent EGD with mod-severe gastritis, could be contributing to current abd pain Denies EtOH use, NSAIDs Take PPI 30 min before eating  Vitamin D deficiency Very low level, now completed 8 weeks of replacement, will recheck level -     VITAMIN D 25 Hydroxy (Vit-D Deficiency, Fractures)   Follow up plan: Return in about 3 months (around 07/06/2015).  [name], MD [facility] 04/05/2015, 3:36 PM  ADDENDUM: Labs normal other than Vit D still low. Will refill. If still with abd pain, consider RUQ u/s.

## 2015-04-06 LAB — CBC WITH DIFFERENTIAL/PLATELET
Basophils Absolute: 0 10*3/uL (ref 0.0–0.2)
Basos: 1 %
EOS (ABSOLUTE): 0.2 10*3/uL (ref 0.0–0.4)
EOS: 3 %
HEMATOCRIT: 40 % (ref 34.0–46.6)
Hemoglobin: 13.9 g/dL (ref 11.1–15.9)
IMMATURE GRANULOCYTES: 0 %
Immature Grans (Abs): 0 10*3/uL (ref 0.0–0.1)
LYMPHS: 24 %
Lymphocytes Absolute: 1.8 10*3/uL (ref 0.7–3.1)
MCH: 30.8 pg (ref 26.6–33.0)
MCHC: 34.8 g/dL (ref 31.5–35.7)
MCV: 89 fL (ref 79–97)
MONOCYTES: 4 %
MONOS ABS: 0.3 10*3/uL (ref 0.1–0.9)
NEUTROS PCT: 68 %
Neutrophils Absolute: 5 10*3/uL (ref 1.4–7.0)
PLATELETS: 279 10*3/uL (ref 150–379)
RBC: 4.52 x10E6/uL (ref 3.77–5.28)
RDW: 13.6 % (ref 12.3–15.4)
WBC: 7.3 10*3/uL (ref 3.4–10.8)

## 2015-04-06 LAB — CMP14+EGFR
ALK PHOS: 63 IU/L (ref 39–117)
ALT: 13 IU/L (ref 0–32)
AST: 13 IU/L (ref 0–40)
Albumin/Globulin Ratio: 1.7 (ref 1.2–2.2)
Albumin: 4 g/dL (ref 3.5–5.5)
BUN/Creatinine Ratio: 12 (ref 9–23)
BUN: 8 mg/dL (ref 6–24)
Bilirubin Total: 0.3 mg/dL (ref 0.0–1.2)
CO2: 24 mmol/L (ref 18–29)
CREATININE: 0.67 mg/dL (ref 0.57–1.00)
Calcium: 9.2 mg/dL (ref 8.7–10.2)
Chloride: 102 mmol/L (ref 96–106)
GFR calc Af Amer: 120 mL/min/{1.73_m2} (ref >59)
GFR calc non Af Amer: 104 mL/min/{1.73_m2} (ref >59)
GLUCOSE: 87 mg/dL (ref 65–99)
Globulin, Total: 2.4 g/dL (ref 1.5–4.5)
Potassium: 4 mmol/L (ref 3.5–5.2)
SODIUM: 140 mmol/L (ref 134–144)
Total Protein: 6.4 g/dL (ref 6.0–8.5)

## 2015-04-06 LAB — VITAMIN D 25 HYDROXY (VIT D DEFICIENCY, FRACTURES): VIT D 25 HYDROXY: 23.7 ng/mL — AB (ref 30.0–100.0)

## 2015-04-06 LAB — URINE CULTURE

## 2015-04-06 LAB — LIPASE: Lipase: 33 U/L (ref 0–59)

## 2015-04-07 DIAGNOSIS — K635 Polyp of colon: Secondary | ICD-10-CM

## 2015-04-07 HISTORY — DX: Polyp of colon: K63.5

## 2015-04-09 MED ORDER — VITAMIN D (ERGOCALCIFEROL) 1.25 MG (50000 UNIT) PO CAPS: 50000.0000 [IU] | ORAL_CAPSULE | ORAL | Status: DC

## 2015-04-09 NOTE — Addendum Note (Signed)
Addended by: Eustaquio Maize on: 04/09/2015 07:44 AM   Modules accepted: Orders

## 2015-04-10 ENCOUNTER — Ambulatory Visit (AMBULATORY_SURGERY_CENTER): Payer: BLUE CROSS/BLUE SHIELD | Admitting: Internal Medicine

## 2015-04-10 ENCOUNTER — Encounter: Payer: Self-pay | Admitting: Internal Medicine

## 2015-04-10 VITALS — BP 127/71 | HR 64 | Temp 98.0°F | Resp 13 | Ht 65.0 in | Wt 164.0 lb

## 2015-04-10 DIAGNOSIS — Z800 Family history of malignant neoplasm of digestive organs: Secondary | ICD-10-CM

## 2015-04-10 DIAGNOSIS — K635 Polyp of colon: Secondary | ICD-10-CM

## 2015-04-10 DIAGNOSIS — Z1211 Encounter for screening for malignant neoplasm of colon: Secondary | ICD-10-CM | POA: Diagnosis not present

## 2015-04-10 DIAGNOSIS — D125 Benign neoplasm of sigmoid colon: Secondary | ICD-10-CM

## 2015-04-10 DIAGNOSIS — D124 Benign neoplasm of descending colon: Secondary | ICD-10-CM | POA: Diagnosis not present

## 2015-04-10 DIAGNOSIS — D127 Benign neoplasm of rectosigmoid junction: Secondary | ICD-10-CM | POA: Diagnosis not present

## 2015-04-10 MED ORDER — SODIUM CHLORIDE 0.9 % IV SOLN: 500.0000 mL | INTRAVENOUS | Status: DC

## 2015-04-10 NOTE — Patient Instructions (Signed)
YOU HAD AN ENDOSCOPIC PROCEDURE TODAY AT [facility]:   Refer to the procedure report that was given to you for any specific questions about what was found during the examination.  If the procedure report does not answer your questions, please call your gastroenterologist to clarify.  If you requested that your care partner not be given the details of your procedure findings, then the procedure report has been included in a sealed envelope for you to review at your convenience later.  YOU SHOULD EXPECT: Some feelings of bloating in the abdomen. Passage of more gas than usual.  Walking can help get rid of the air that was put into your GI tract during the procedure and reduce the bloating. If you had a lower endoscopy (such as a colonoscopy or flexible sigmoidoscopy) you may notice spotting of blood in your stool or on the toilet paper. If you underwent a bowel prep for your procedure, you may not have a normal bowel movement for a few days.  Please Note:  You might notice some irritation and congestion in your nose or some drainage.  This is from the oxygen used during your procedure.  There is no need for concern and it should clear up in a day or so.  SYMPTOMS TO REPORT IMMEDIATELY:   Following lower endoscopy (colonoscopy or flexible sigmoidoscopy):  Excessive amounts of blood in the stool  Significant tenderness or worsening of abdominal pains  Swelling of the abdomen that is new, acute  Fever of 100F or higher     For urgent or emergent issues, a gastroenterologist can be reached at any hour by calling [phone].   DIET: Your first meal following the procedure should be a small meal and then it is ok to progress to your normal diet. Heavy or fried foods are harder to digest and may make you feel nauseous or bloated.  Likewise, meals heavy in dairy and vegetables can increase bloating.  Drink plenty of fluids but you should avoid alcoholic beverages for 24  hours.  ACTIVITY:  You should plan to take it easy for the rest of today and you should NOT DRIVE or use heavy machinery until tomorrow (because of the sedation medicines used during the test).    FOLLOW UP: Our staff will call the number listed on your records the next business day following your procedure to check on you and address any questions or concerns that you may have regarding the information given to you following your procedure. If we do not reach you, we will leave a message.  However, if you are feeling well and you are not experiencing any problems, there is no need to return our call.  We will assume that you have returned to your regular daily activities without incident.  If any biopsies were taken you will be contacted by phone or by letter within the next 1-3 weeks.  Please call us at [phone] if you have not heard about the biopsies in 3 weeks.    SIGNATURES/CONFIDENTIALITY: You and/or your care partner have signed paperwork which will be entered into your electronic medical record.  These signatures attest to the fact that that the information above on your After Visit Summary has been reviewed and is understood.  Full responsibility of the confidentiality of this discharge information lies with you and/or your care-partner.  Polyp information given.  Return for repeat colonoscopy using different prep

## 2015-04-10 NOTE — Progress Notes (Signed)
Called to room to assist during endoscopic procedure.  Patient ID and intended procedure confirmed with present staff. Received instructions for my participation in the procedure from the performing physician.  

## 2015-04-10 NOTE — Op Note (Signed)
[facility] Patient Name: Heather [name] Procedure Date: 04/10/2015 9:42 AM MRN: [id number] Endoscopist: Jerene Bears , MD Age: 50 Referring MD:  Date of Birth: Aug 05, 1965 Gender: Female Procedure:                Colonoscopy Indications:              Screening in patient at increased risk: Family                            history of 1st-degree relative with colorectal                            cancer, This is the patient's first colonoscopy Medicines:                Monitored Anesthesia Care Procedure:                Pre-Anesthesia Assessment:                           - Prior to the procedure, a History and Physical                            was performed, and patient medications and                            allergies were reviewed. The patient's tolerance of                            previous anesthesia was also reviewed. The risks                            and benefits of the procedure and the sedation                            options and risks were discussed with the patient.                            All questions were answered, and informed consent                            was obtained. Prior Anticoagulants: The patient has                            taken no previous anticoagulant or antiplatelet                            agents. ASA Grade Assessment: II - A patient with                            mild systemic disease. After reviewing the risks                            and benefits, the patient was deemed in  satisfactory condition to undergo the procedure.                           After obtaining informed consent, the colonoscope                            was passed under direct vision. Throughout the                            procedure, the patient's blood pressure, pulse, and                            oxygen saturations were monitored continuously. The                            Model PCF-H190L [id number]) scope was  introduced                            through the anus and advanced to the the cecum,                            identified by appendiceal orifice and ileocecal                            valve. The colonoscopy was performed without                            difficulty. The patient tolerated the procedure                            well. The quality of the bowel preparation was fair                            except the ascending colon was poor. The ileocecal                            valve, appendiceal orifice, and rectum were                            photographed. Scope In: 9:49:52 AM Scope Out: 10:02:39 AM Scope Withdrawal Time: 0 hours 10 minutes 29 seconds  Total Procedure Duration: 0 hours 12 minutes 47 seconds  Findings:      The digital rectal exam was normal.      A large amount of stool was found in the proximal transverse colon, in       the ascending colon and in the cecum, making visualization difficult.      Three sessile polyps were found in the recto-sigmoid colon and       descending colon. The polyps were 3 to 6 mm in size. These polyps were       removed with a cold snare. Resection was complete, but the polyp tissue       was only partially retrieved (3 mm descending colon polyp not retrieved).      The retroflexed view of the distal rectum and anal verge was  normal and       showed no anal or rectal abnormalities. Complications:            No immediate complications. Estimated Blood Loss:     Estimated blood loss: none. Impression:               - Stool in the proximal transverse colon, in the                            ascending colon and in the cecum.                           - Three 3 to 6 mm polyps at the recto-sigmoid colon                            and in the descending colon, removed with a cold                            snare. Complete resection. Partial retrieval.                           - The distal rectum and anal verge are normal on                             retroflexion view. Recommendation:           - Patient has a contact number available for                            emergencies. The signs and symptoms of potential                            delayed complications were discussed with the                            patient. Return to normal activities tomorrow.                            Written discharge instructions were provided to the                            patient.                           - Resume previous diet.                           - Continue present medications.                           - Repeat colonoscopy at appointment to be scheduled                            because the bowel preparation was poor for adequate  screening. Procedure Code(s):        --- Professional ---                           [phone], Colonoscopy, flexible; with removal of                            tumor(s), polyp(s), or other lesion(s) by snare                            technique CPT copyright 2016 [employer]. All rights reserved. Jerene Bears, MD 04/10/2015 10:09:40 AM This report has been signed electronically. Number of Addenda: 0 Referring MD:      Erline Hau

## 2015-04-10 NOTE — Progress Notes (Signed)
A and Ox 3 Report to RN 

## 2015-04-11 ENCOUNTER — Telehealth: Payer: Self-pay

## 2015-04-11 NOTE — Telephone Encounter (Signed)
Left a message at [phone] for the pt to call us back if any questions or concerns. maw

## 2015-04-21 ENCOUNTER — Other Ambulatory Visit: Payer: Self-pay | Admitting: Pediatrics

## 2015-04-23 ENCOUNTER — Encounter: Payer: Self-pay | Admitting: Internal Medicine

## 2015-04-23 NOTE — Telephone Encounter (Signed)
Last seen 04/05/15  Dr Evette Doffing

## 2015-05-09 ENCOUNTER — Encounter (INDEPENDENT_AMBULATORY_CARE_PROVIDER_SITE_OTHER): Payer: Self-pay

## 2015-05-10 ENCOUNTER — Ambulatory Visit (INDEPENDENT_AMBULATORY_CARE_PROVIDER_SITE_OTHER): Payer: BLUE CROSS/BLUE SHIELD | Admitting: Family Medicine

## 2015-05-10 ENCOUNTER — Encounter: Payer: Self-pay | Admitting: Family Medicine

## 2015-05-10 VITALS — BP 126/79 | HR 71 | Temp 97.7°F | Ht 61.0 in | Wt 171.2 lb

## 2015-05-10 DIAGNOSIS — R1013 Epigastric pain: Secondary | ICD-10-CM | POA: Diagnosis not present

## 2015-05-10 MED ORDER — SUCRALFATE 1 G PO TABS: 1.0000 g | ORAL_TABLET | Freq: Three times a day (TID) | ORAL | Status: DC

## 2015-05-10 NOTE — Patient Instructions (Addendum)
Great to see you!  We are going to arrange an ultrasound to look at your abdomen I am starting you on carafate to see if that will soothe your stomach lining  Start pepcid twice daily (OTC) in addition to the protonix

## 2015-05-10 NOTE — Progress Notes (Signed)
   HPI  Patient presents today with worsening abdominal pain.  Patient's when she's had about 3 months abdominal pain that slowly progressed and is now almost constant. She describes epigastric sharp abdominal pain that radiates to the right upper quadrant  There are no aggravating or alleviating factors. She's tried Protonix, 40 and 80 mg with no improvement. She's tried a GI cocktail and Pepto-Bismol with no improvement. She had an EGD in February which showed gastritis, a colonoscopy was performed which had an inadequate cleanout, showing only a few polyps that were removed but no other findings.  She notes nausea, vomiting, or diarrhea with every meal. She is tolerating fluids in small amounts of food, however she does always have symptoms afterwards.    PMH: Smoking status noted ROS: Per HPI  Objective: BP 126/79 mmHg  Pulse 71  Temp(Src) 97.7 F (36.5 C) (Oral)  Ht 5\' 1"  (1.549 m)  Wt 171 lb 3.2 oz (77.656 kg)  BMI 32.36 kg/m2  LMP 04/07/2015 Gen: NAD, alert, cooperative with exam HEENT: NCAT, moist mucosa CV: RRR, good S1/S2, no murmur Resp: CTABL, no wheezes, non-labored Abd: Soft, positive bowel sounds, tenderness to palpation in the epigastric area and left upper quadrant, and right upper quadrant, no rebound Ext: No edema, warm Neuro: Alert and oriented, No gross deficits  Assessment and plan:  # Epigastric abdominal pain Continued, slightly worse Ultrasound Adding H2 blockers and PPI Also trying Carafate Follow-up 2 weeks, consider CT scan if no findings on the ultrasound      Orders Placed This Encounter  Procedures  . US Abdomen Complete    Standing Status: Future     Number of Occurrences:      Standing Expiration Date: 07/09/2016    Order Specific Question:  Reason for Exam (SYMPTOM  OR DIAGNOSIS REQUIRED)    Answer:  epigastric abd pain radiating to RUQ    Order Specific Question:  Preferred imaging location?    Answer:  [facility]     Meds ordered this encounter  Medications  . sucralfate (CARAFATE) 1 g tablet    Sig: Take 1 tablet (1 g total) by mouth 4 (four) times daily -  with meals and at bedtime.    Dispense:  120 tablet    Refill:  0    Laroy Apple, MD [facility] 05/10/2015, 3:48 PM

## 2015-05-15 ENCOUNTER — Emergency Department (HOSPITAL_COMMUNITY): Payer: BLUE CROSS/BLUE SHIELD

## 2015-05-15 ENCOUNTER — Encounter (HOSPITAL_COMMUNITY): Payer: Self-pay

## 2015-05-15 ENCOUNTER — Emergency Department (HOSPITAL_COMMUNITY)
Admission: EM | Admit: 2015-05-15 | Discharge: 2015-05-15 | Disposition: A | Discharge status: Home / Self Care | Payer: BLUE CROSS/BLUE SHIELD | Attending: Emergency Medicine | Admitting: Emergency Medicine

## 2015-05-15 DIAGNOSIS — Z8673 Personal history of transient ischemic attack (TIA), and cerebral infarction without residual deficits: Secondary | ICD-10-CM | POA: Diagnosis not present

## 2015-05-15 DIAGNOSIS — R200 Anesthesia of skin: Secondary | ICD-10-CM | POA: Diagnosis not present

## 2015-05-15 DIAGNOSIS — Z79899 Other long term (current) drug therapy: Secondary | ICD-10-CM | POA: Diagnosis not present

## 2015-05-15 DIAGNOSIS — F17210 Nicotine dependence, cigarettes, uncomplicated: Secondary | ICD-10-CM | POA: Insufficient documentation

## 2015-05-15 DIAGNOSIS — Z85038 Personal history of other malignant neoplasm of large intestine: Secondary | ICD-10-CM | POA: Insufficient documentation

## 2015-05-15 DIAGNOSIS — R079 Chest pain, unspecified: Secondary | ICD-10-CM

## 2015-05-15 HISTORY — DX: Cerebral infarction, unspecified: I63.9

## 2015-05-15 LAB — BASIC METABOLIC PANEL
Anion gap: 9 (ref 5–15)
BUN: 13 mg/dL (ref 6–20)
CO2: 22 mmol/L (ref 22–32)
Calcium: 9.1 mg/dL (ref 8.9–10.3)
Chloride: 109 mmol/L (ref 101–111)
Creatinine, Ser: 0.72 mg/dL (ref 0.44–1.00)
GFR calc Af Amer: >60 mL/min (ref >60)
GFR calc non Af Amer: >60 mL/min (ref >60)
Glucose, Bld: 105 mg/dL — ABNORMAL HIGH (ref 65–99)
Potassium: 3.8 mmol/L (ref 3.5–5.1)
Sodium: 140 mmol/L (ref 135–145)

## 2015-05-15 LAB — CBC WITH DIFFERENTIAL/PLATELET
Basophils Absolute: 0 10*3/uL (ref 0.0–0.1)
Basophils Relative: 0 %
Eosinophils Absolute: 0.3 10*3/uL (ref 0.0–0.7)
Eosinophils Relative: 4 %
HCT: 40.8 % (ref 36.0–46.0)
Hemoglobin: 14.5 g/dL (ref 12.0–15.0)
Lymphocytes Relative: 27 %
Lymphs Abs: 1.9 10*3/uL (ref 0.7–4.0)
MCH: 31.1 pg (ref 26.0–34.0)
MCHC: 35.5 g/dL (ref 30.0–36.0)
MCV: 87.6 fL (ref 78.0–100.0)
Monocytes Absolute: 0.4 10*3/uL (ref 0.1–1.0)
Monocytes Relative: 5 %
Neutro Abs: 4.4 10*3/uL (ref 1.7–7.7)
Neutrophils Relative %: 64 %
Platelets: 254 10*3/uL (ref 150–400)
RBC: 4.66 MIL/uL (ref 3.87–5.11)
RDW: 13.6 % (ref 11.5–15.5)
WBC: 7 10*3/uL (ref 4.0–10.5)

## 2015-05-15 LAB — TROPONIN I
Troponin I: <0.03 ng/mL (ref <0.031)
Troponin I: <0.03 ng/mL (ref <0.031)

## 2015-05-15 MED ORDER — LORAZEPAM 2 MG/ML IJ SOLN
1.0000 mg | Freq: Once | INTRAMUSCULAR | Status: AC
  Administered 2015-05-15: 1 mg via INTRAVENOUS
  Filled 2015-05-15: qty 1

## 2015-05-15 NOTE — ED Notes (Signed)
EDP consulted.

## 2015-05-15 NOTE — ED Notes (Signed)
Pt reports woke up dizzy this morning and at 1530 she started having numbness in mouth then sharp chest pain that radiated to r arm then r side of body went numb.  EMS reports cbg was 129.  VSS.

## 2015-05-15 NOTE — ED Notes (Signed)
Dr. Wilson Singer examined pt on arrival.  No code stroke at this time.

## 2015-05-16 ENCOUNTER — Telehealth: Payer: Self-pay | Admitting: *Deleted

## 2015-05-16 ENCOUNTER — Encounter: Payer: Self-pay | Admitting: Family Medicine

## 2015-05-16 ENCOUNTER — Encounter (INDEPENDENT_AMBULATORY_CARE_PROVIDER_SITE_OTHER): Payer: Self-pay

## 2015-05-16 ENCOUNTER — Ambulatory Visit (INDEPENDENT_AMBULATORY_CARE_PROVIDER_SITE_OTHER): Payer: BLUE CROSS/BLUE SHIELD | Admitting: Family Medicine

## 2015-05-16 VITALS — BP 114/74 | HR 81 | Temp 98.1°F | Ht 62.5 in | Wt 165.6 lb

## 2015-05-16 DIAGNOSIS — R072 Precordial pain: Secondary | ICD-10-CM

## 2015-05-16 DIAGNOSIS — R202 Paresthesia of skin: Secondary | ICD-10-CM | POA: Diagnosis not present

## 2015-05-16 DIAGNOSIS — G629 Polyneuropathy, unspecified: Secondary | ICD-10-CM | POA: Diagnosis not present

## 2015-05-16 NOTE — Progress Notes (Signed)
Subjective:  Patient ID: Heather [name], female    DOB: [birth year]  Age: 50 y.o. MRN: [id number]  CC: Follow-up  Had light heart attack last night HPI Heather [name] presents for sharp pain substernal, moved to right side tingling in right leg down to her toes. Leg hurts, stiff. Aching in LLE as well. Onset yesterday at 3:30 PM. Went to E.D. At [facility] had blood work every three hours for 8 hours.    History Heather [name] has a past medical history of Anxiety; Emphysema of lung ([place]); GERD (gastroesophageal reflux disease); Stroke [facility]); and Cancer ([place]).   She has past surgical history that includes External ear surgery; Skin graft; and Colon surgery.   Her family history includes Bladder Cancer in her mother; Breast cancer in her sister; Colon cancer (age of onset: 47) in her paternal aunt; Colon cancer (age of onset: 72) in her mother; Heart disease in her father, maternal aunt, and maternal uncle; Heart failure in her mother.She reports that she has been smoking Cigarettes.  She has been smoking about 1.00 pack per day. She has never used smokeless tobacco. She reports that she does not drink alcohol or use illicit drugs.    ROS Review of Systems  Constitutional: Negative for fever, activity change and appetite change.  HENT: Negative for congestion, rhinorrhea and sore throat.   Eyes: Negative for visual disturbance.  Respiratory: Negative for cough and shortness of breath.   Cardiovascular: Positive for chest pain and leg swelling (RLE). Negative for palpitations.  Gastrointestinal: Negative for nausea, abdominal pain and diarrhea.  Genitourinary: Negative for dysuria.  Musculoskeletal: Negative for myalgias and arthralgias.    Objective:  BP 114/74 mmHg  Pulse 81  Temp(Src) 98.1 F (36.7 C) (Oral)  Ht 5' 2.5" (1.588 m)  Wt 165 lb 9.6 oz (75.116 kg)  BMI 29.79 kg/m2  SpO2 98%  LMP 04/07/2015  BP Readings from Last 3 Encounters:  05/16/15 114/74  05/15/15 124/77    05/10/15 126/79    Wt Readings from Last 3 Encounters:  05/16/15 165 lb 9.6 oz (75.116 kg)  05/15/15 171 lb (77.565 kg)  05/10/15 171 lb 3.2 oz (77.656 kg)     Physical Exam  Constitutional: She is oriented to person, place, and time. She appears well-developed and well-nourished. No distress.  HENT:  Head: Normocephalic and atraumatic.  Right Ear: External ear normal.  Left Ear: External ear normal.  Nose: Nose normal.  Mouth/Throat: Oropharynx is clear and moist.  Eyes: Conjunctivae and EOM are normal. Pupils are equal, round, and reactive to light.  Neck: Normal range of motion. Neck supple. No thyromegaly present.  Cardiovascular: Normal rate, regular rhythm and normal heart sounds.   No murmur heard. Pulmonary/Chest: Effort normal and breath sounds normal. No respiratory distress. She has no wheezes. She has no rales.  Abdominal: Soft. Bowel sounds are normal. She exhibits no distension. There is no tenderness.  Lymphadenopathy:    She has no cervical adenopathy.  Neurological: She is alert and oriented to person, place, and time. She has normal reflexes.  Skin: Skin is warm and dry.  Psychiatric: She has a normal mood and affect. Her behavior is normal. Judgment and thought content normal.     Lab Results  Component Value Date   WBC 7.0 05/15/2015   HGB 14.5 05/15/2015   HCT 40.8 05/15/2015   PLT 254 05/15/2015   GLUCOSE 105* 05/15/2015   CHOL 148 12/06/2014   TRIG 62 12/06/2014   HDL  46 12/06/2014   LDLCALC 90 12/06/2014   ALT 13 04/05/2015   AST 13 04/05/2015   NA 140 05/15/2015   K 3.8 05/15/2015   CL 109 05/15/2015   CREATININE 0.72 05/15/2015   BUN 13 05/15/2015   CO2 22 05/15/2015   TSH 2.10 02/08/2015   INR 0.93 03/19/2015    Dg Chest 2 View  05/15/2015  CLINICAL DATA:  Dizziness starting this morning.  Chest pain EXAM: CHEST  2 VIEW COMPARISON:  03/19/2015 FINDINGS: The heart size and mediastinal contours are within normal limits. Both lungs are  clear. The visualized skeletal structures are unremarkable. IMPRESSION: No active cardiopulmonary disease. Electronically Signed   By: Van Clines M.D.   On: 05/15/2015 17:39   Ct Head Wo Contrast  05/15/2015  CLINICAL DATA:  Dizziness starting this morning. Numbness in the mouth starting at 3:30 p.m. Chest pain. EXAM: CT HEAD WITHOUT CONTRAST TECHNIQUE: Contiguous axial images were obtained from the base of the skull through the vertex without intravenous contrast. COMPARISON:  03/19/2015 FINDINGS: Stable oval-shaped calcification in the right frontal lobe measuring 6 mm in long axis on image 14/2, no change from the prior exam. Otherwise, the brainstem, cerebellum, cerebral peduncles, thalami, basal ganglia, basilar cisterns, and ventricular system appear within normal limits. No intracranial hemorrhage, mass lesion, or acute CVA. Small left mastoid effusion. No middle ear fluid. IMPRESSION: 1. Small left mastoid effusion. 2. Oval-shaped calcification in the right frontal lobe along the periventricular white matter, stable and probably from remote inflammation. There is no surrounding vasogenic edema or mass lesion identified. Electronically Signed   By: Van Clines M.D.   On: 05/15/2015 17:53    Assessment & Plan:   Heather [name] was seen today for follow-up.  Diagnoses and all orders for this visit:  Precordial pain -     EKG 12-Lead  Paresthesia of lower extremity -     Nerve conduction test; Future  Neuropathy (HCC) -     Nerve conduction test; Future     I am having Heather [name] maintain her pantoprazole, escitalopram, ondansetron, Vitamin D (Ergocalciferol), gabapentin, rizatriptan, and sucralfate.  No orders of the defined types were placed in this encounter.     Follow-up: Return in about 1 month (around 06/16/2015), or if symptoms worsen or fail to improve.  Claretta Fraise, M.D.

## 2015-05-16 NOTE — Telephone Encounter (Signed)
Dr Hilarie Fredrickson, This patient was to come in today for a PV and then see you 5-24, Wednesday for a colon. She had a colon 04-10-15 with large amount of stool with polyps and was coming back with an extensive prep.  She NS'd PV this am and I called her for which she stated she had a mild MI this am . She is to see her PCP and will ask about cardio referral she stated.  Informed her she has to be 3 months at least out from this MI for [place] to do her colon as per guidelines but would let you know this as well and make sure you had no other recommendations for her. Please let me know if you need me to call her for further instruction Thanks, Lelan Pons

## 2015-05-16 NOTE — Telephone Encounter (Signed)
Thanks for the note. Yes, we need to wait 3 months from MI for colonoscopy with 2 day bowel prep She will have seen cardiology in that time frame Would reschedule colonoscopy pre-visit for Aug 2017

## 2015-05-17 ENCOUNTER — Encounter: Payer: Self-pay | Admitting: Family Medicine

## 2015-05-18 ENCOUNTER — Encounter: Payer: Self-pay | Admitting: Family Medicine

## 2015-05-18 NOTE — Telephone Encounter (Signed)
Noted- i placed a note about her prep in notes on procedure/ PV Lelan Pons PV

## 2015-05-20 NOTE — ED Provider Notes (Signed)
CSN: [id number]     Arrival date & time 05/15/15  1650 History   First MD Initiated Contact with Patient 05/15/15 1659     Chief Complaint  Patient presents with  . Numbness  . Chest Pain     (Consider location/radiation/quality/duration/timing/severity/associated sxs/prior Treatment) HPI   50 year old female with chest pain. Onset of pain was from she woke up this morning. Worsening throughout the day. Sharp pain in the center of her chest. Around 3:30 (range her right arm began having weakness/numbness in both right upper and right lower extremity. This has been persistent since then. No acute respiratory complaints. No nausea vomiting. Denies any headaches. No acute visual changes. No fevers or chills. No acute cough.  Past Medical History  Diagnosis Date  . Anxiety   . Emphysema of lung ([place])   . GERD (gastroesophageal reflux disease)   . Stroke ([place])   . Cancer [facility])     colon   Past Surgical History  Procedure Laterality Date  . External ear surgery    . Skin graft    . Colon surgery     Family History  Problem Relation Age of Onset  . Heart failure Mother   . Colon cancer Mother 35  . Bladder Cancer Mother   . Heart disease Father   . Breast cancer Sister   . Heart disease Maternal Aunt   . Heart disease Maternal Uncle   . Colon cancer Paternal Aunt 60   Social History  Substance Use Topics  . Smoking status: Current Every Day Smoker -- 1.00 packs/day    Types: Cigarettes  . Smokeless tobacco: Never Used     Comment: 02-08-2015 info given  . Alcohol Use: No   OB History    No data available     Review of Systems  All systems reviewed and negative, other than as noted in HPI.   Allergies  Aleve; Asa; Excedrin; and Vicodin  Home Medications   Prior to Admission medications   Medication Sig Start Date End Date Taking? Authorizing Provider  escitalopram (LEXAPRO) 5 MG tablet Take 1 tablet (5 mg total) by mouth daily. 03/16/15  Yes Eustaquio Maize, MD   gabapentin (NEURONTIN) 300 MG capsule TAKE 1 CAPSULE (300 MG TOTAL) BY MOUTH 2 (TWO) TIMES DAILY. 03/22/15  Yes Historical Provider, MD  ondansetron (ZOFRAN) 4 MG tablet Take 1 tablet (4 mg total) by mouth every 8 (eight) hours as needed for nausea or vomiting. 04/05/15  Yes Eustaquio Maize, MD  pantoprazole (PROTONIX) 40 MG tablet Take 1 tablet (40 mg total) by mouth daily. 02/14/15  Yes Jerene Bears, MD  rizatriptan (MAXALT) 10 MG tablet TAKE 1 TABLET (10 MG TOTAL) BY MOUTH AS NEEDED FOR MIGRAINE. MAY REPEAT IN 2 HOURS IF NEEDED 04/23/15  Yes Mary-Margaret Hassell Done, FNP  sucralfate (CARAFATE) 1 g tablet Take 1 tablet (1 g total) by mouth 4 (four) times daily -  with meals and at bedtime. 05/10/15  Yes Timmothy Euler, MD  Vitamin D, Ergocalciferol, (DRISDOL) 50000 units CAPS capsule Take 1 capsule (50,000 Units total) by mouth every 7 (seven) days. 04/09/15   Eustaquio Maize, MD   BP 124/77 mmHg  Pulse 74  Temp(Src) 98.2 F (36.8 C) (Oral)  Resp 15  Ht 5\' 5"  (1.651 m)  Wt 171 lb (77.565 kg)  BMI 28.46 kg/m2  SpO2 94%  LMP 04/07/2015 Physical Exam  Constitutional: She appears well-developed and well-nourished. No distress.  HENT:  Head: Normocephalic and  atraumatic.  Eyes: Conjunctivae are normal. Right eye exhibits no discharge. Left eye exhibits no discharge.  Neck: Neck supple.  Cardiovascular: Normal rate, regular rhythm and normal heart sounds.  Exam reveals no gallop and no friction rub.   No murmur heard. Pulmonary/Chest: Effort normal and breath sounds normal. No respiratory distress. She exhibits no tenderness.  Abdominal: Soft. She exhibits no distension. There is no tenderness.  Musculoskeletal: She exhibits no edema or tenderness.  Lower extremities symmetric as compared to each other. No calf tenderness. Negative Homan's. No palpable cords.   Neurological: She is alert.  Speech clear. Content appropriate. Cranial nerves II through XII appear to be intact. Difficult to get a good  assessment of her motor strength. Her exam is very inconsistent. Moving around in bed using all extremities. When initially asked her to move her right arm and leg on command though she would not. When raising her arms in front of her to assess for pronator drift she immediately dropped them both and did this again despite repeated instructions to keep him up and her claims that child had weakness on her right side. She is then able to raise them both by herself keep them up but required a lot of coaxing. Similar issues with assessing motor strength in her lower extremities. Reports decreased sensation to light touch right upper right lower extremity.  Skin: Skin is warm and dry.  Psychiatric: She has a normal mood and affect. Her behavior is normal. Thought content normal.  Nursing note and vitals reviewed.   ED Course  Procedures (including critical care time) Labs Review Labs Reviewed  BASIC METABOLIC PANEL - Abnormal; Notable for the following:    Glucose, Bld 105 (*)    All other components within normal limits  CBC WITH DIFFERENTIAL/PLATELET  TROPONIN I  TROPONIN I    Imaging Review No results found. I have personally reviewed and evaluated these images and lab results as part of my medical decision-making.   EKG Interpretation   Date/Time:  Tuesday May 15 2015 17:01:58 EDT Ventricular Rate:  68 PR Interval:  161 QRS Duration: 100 QT Interval:  420 QTC Calculation: 447 R Axis:   75 Text Interpretation:  Sinus rhythm Low voltage, precordial leads RSR' in  V1 or V2, right VCD or RVH Non-specific ST-t changes Confirmed by Wilson Singer   MD, Lindsie Simar ([id number]) on 05/15/2015 7:08:04 PM      MDM   Final diagnoses:  Chest pain, unspecified chest pain type    50 year old female with chest pain. atypical for ACS. Doubt dissection, PE or serious infectious process. She has no neurological complaints but her exam is very inconsistent. It is very effort related. CT of her head does not show  any acute process. I doubt that this is acute CVA. I feel she is appropriate for discharge at this time. Return precautions were discussed. Outpatient follow-up otherwise.    Virgel Manifold, MD 05/20/15 [phone]

## 2015-05-22 ENCOUNTER — Ambulatory Visit (HOSPITAL_COMMUNITY): Admission: RE | Admit: 2015-05-22 | Payer: BLUE CROSS/BLUE SHIELD | Source: Ambulatory Visit

## 2015-05-22 ENCOUNTER — Encounter (HOSPITAL_COMMUNITY): Payer: Self-pay | Admitting: Emergency Medicine

## 2015-05-22 ENCOUNTER — Emergency Department (HOSPITAL_COMMUNITY): Payer: BLUE CROSS/BLUE SHIELD

## 2015-05-22 ENCOUNTER — Observation Stay (HOSPITAL_COMMUNITY)
Admission: EM | Admit: 2015-05-22 | Discharge: 2015-05-23 | Disposition: A | Discharge status: Home / Self Care | Payer: BLUE CROSS/BLUE SHIELD | Attending: Internal Medicine | Admitting: Internal Medicine

## 2015-05-22 DIAGNOSIS — Z8673 Personal history of transient ischemic attack (TIA), and cerebral infarction without residual deficits: Secondary | ICD-10-CM | POA: Diagnosis not present

## 2015-05-22 DIAGNOSIS — F17210 Nicotine dependence, cigarettes, uncomplicated: Secondary | ICD-10-CM | POA: Diagnosis not present

## 2015-05-22 DIAGNOSIS — F17200 Nicotine dependence, unspecified, uncomplicated: Secondary | ICD-10-CM | POA: Diagnosis present

## 2015-05-22 DIAGNOSIS — F411 Generalized anxiety disorder: Secondary | ICD-10-CM | POA: Diagnosis present

## 2015-05-22 DIAGNOSIS — R200 Anesthesia of skin: Secondary | ICD-10-CM | POA: Insufficient documentation

## 2015-05-22 DIAGNOSIS — R531 Weakness: Secondary | ICD-10-CM | POA: Insufficient documentation

## 2015-05-22 DIAGNOSIS — R0789 Other chest pain: Secondary | ICD-10-CM | POA: Diagnosis not present

## 2015-05-22 DIAGNOSIS — R2990 Unspecified symptoms and signs involving the nervous system: Secondary | ICD-10-CM

## 2015-05-22 DIAGNOSIS — Z85038 Personal history of other malignant neoplasm of large intestine: Secondary | ICD-10-CM | POA: Diagnosis not present

## 2015-05-22 DIAGNOSIS — K219 Gastro-esophageal reflux disease without esophagitis: Secondary | ICD-10-CM | POA: Diagnosis present

## 2015-05-22 LAB — CBC
HCT: 38.3 % (ref 36.0–46.0)
HEMOGLOBIN: 13.3 g/dL (ref 12.0–15.0)
MCH: 30.5 pg (ref 26.0–34.0)
MCHC: 34.7 g/dL (ref 30.0–36.0)
MCV: 87.8 fL (ref 78.0–100.0)
Platelets: 235 10*3/uL (ref 150–400)
RBC: 4.36 MIL/uL (ref 3.87–5.11)
RDW: 13.1 % (ref 11.5–15.5)
WBC: 7.7 10*3/uL (ref 4.0–10.5)

## 2015-05-22 LAB — ETHANOL: Alcohol, Ethyl (B): <5 mg/dL (ref <5)

## 2015-05-22 LAB — COMPREHENSIVE METABOLIC PANEL
ALBUMIN: 4 g/dL (ref 3.5–5.0)
ALK PHOS: 74 U/L (ref 38–126)
ALT: 18 U/L (ref 14–54)
AST: 17 U/L (ref 15–41)
Anion gap: 8 (ref 5–15)
BILIRUBIN TOTAL: 0.5 mg/dL (ref 0.3–1.2)
BUN: 14 mg/dL (ref 6–20)
CHLORIDE: 104 mmol/L (ref 101–111)
CO2: 24 mmol/L (ref 22–32)
CREATININE: 0.7 mg/dL (ref 0.44–1.00)
Calcium: 8.8 mg/dL — ABNORMAL LOW (ref 8.9–10.3)
Glucose, Bld: 96 mg/dL (ref 65–99)
POTASSIUM: 3.8 mmol/L (ref 3.5–5.1)
Sodium: 136 mmol/L (ref 135–145)
TOTAL PROTEIN: 7.1 g/dL (ref 6.5–8.1)

## 2015-05-22 LAB — I-STAT CHEM 8, ED
BUN: 13 mg/dL (ref 6–20)
CALCIUM ION: 1.12 mmol/L (ref 1.12–1.23)
CREATININE: 0.8 mg/dL (ref 0.44–1.00)
Chloride: 103 mmol/L (ref 101–111)
Glucose, Bld: 88 mg/dL (ref 65–99)
HCT: 42 % (ref 36.0–46.0)
HEMOGLOBIN: 14.3 g/dL (ref 12.0–15.0)
Potassium: 3.8 mmol/L (ref 3.5–5.1)
SODIUM: 141 mmol/L (ref 135–145)
TCO2: 23 mmol/L (ref 0–100)

## 2015-05-22 LAB — DIFFERENTIAL
Basophils Absolute: 0 10*3/uL (ref 0.0–0.1)
Basophils Relative: 0 %
EOS PCT: 5 %
Eosinophils Absolute: 0.4 10*3/uL (ref 0.0–0.7)
LYMPHS ABS: 1.9 10*3/uL (ref 0.7–4.0)
LYMPHS PCT: 25 %
MONO ABS: 0.4 10*3/uL (ref 0.1–1.0)
MONOS PCT: 5 %
Neutro Abs: 5 10*3/uL (ref 1.7–7.7)
Neutrophils Relative %: 65 %

## 2015-05-22 LAB — PROTIME-INR
INR: 1.04 (ref 0.00–1.49)
Prothrombin Time: 13.8 seconds (ref 11.6–15.2)

## 2015-05-22 LAB — APTT: aPTT: 29 seconds (ref 24–37)

## 2015-05-22 LAB — I-STAT TROPONIN, ED: TROPONIN I, POC: 0 ng/mL (ref 0.00–0.08)

## 2015-05-22 NOTE — ED Notes (Signed)
Patient had difficulty swallowing water, swallow screening ended, patient kept NPO.

## 2015-05-22 NOTE — ED Provider Notes (Signed)
Pt arrived at the Er at 10:55pm.  Pt states she has had some right side numbness and weakness since 830pm.  Also some numbness to right side of face.  Screen exam showed mild facial weakness on right.  No weakness to right arm.  Mild numbness to right arm  Milton Ferguson, MD 05/22/15 2355

## 2015-05-22 NOTE — ED Provider Notes (Signed)
Code stoke activated when pt arrived at the hospital  Milton Ferguson, MD 05/22/15 2359

## 2015-05-22 NOTE — ED Provider Notes (Signed)
CSN: [id number]     Arrival date & time 05/22/15  2307 History  By signing my name below, I, Nicole Kindred, attest that this documentation has been prepared under the direction and in the presence of Merryl Hacker, MD.   Electronically Signed: Nicole Kindred, ED Scribe. 05/23/2015. 12:21 AM   Chief Complaint  Patient presents with  . Chest Pain  . R sided Weakness     The history is provided by the patient. No language interpreter was used.   HPI Comments: Heather [name] is a 50 y.o. female with PMHx of TIA, anxiety, and colon cancerwho presents to the Emergency Department complaining of gradual onset, 10/10, chest pain, onset earlier tonight around 8:30 PM. Pt reports associated right sided numbness/Weakness. Pt states her symptoms present similarly to when she was seen in the ED on 05/15/2015. No other associated symptoms noted. No worsening or alleviating factors noted. Pt denies weakness, difficulty ambulating, difficulty speaking, or any other pertinent symptoms.   Level V caveat for acuity of condition. Patient was medically screened by my colleague prior to arrival. Code stroke initiated.  Of note during her interview with teleneurology, she endorsed 2 distinct episodes last week of similar symptoms that she did not fully recover from.  Past Medical History  Diagnosis Date  . Anxiety   . Emphysema of lung ([place])   . GERD (gastroesophageal reflux disease)   . Stroke ([place])   . Cancer [facility]     colon   Past Surgical History  Procedure Laterality Date  . External ear surgery    . Skin graft    . Colon surgery     Family History  Problem Relation Age of Onset  . Heart failure Mother   . Colon cancer Mother 56  . Bladder Cancer Mother   . Heart disease Father   . Breast cancer Sister   . Heart disease Maternal Aunt   . Heart disease Maternal Uncle   . Colon cancer Paternal Aunt 28   Social History  Substance Use Topics  . Smoking status: Current Every Day Smoker  -- 1.00 packs/day    Types: Cigarettes  . Smokeless tobacco: Never Used     Comment: 02-08-2015 info given  . Alcohol Use: No   OB History    No data available     Review of Systems  Constitutional: Negative for fever.  Respiratory: Negative for shortness of breath.   Cardiovascular: Positive for chest pain. Negative for leg swelling.  Neurological: Positive for numbness. Negative for speech difficulty and weakness.       Notes right sided numbness. Denies difficulty ambulating.   All other systems reviewed and are negative.   Allergies  Aleve; Asa; Excedrin; and Vicodin  Home Medications   Prior to Admission medications   Medication Sig Start Date End Date Taking? Authorizing Provider  escitalopram (LEXAPRO) 5 MG tablet Take 1 tablet (5 mg total) by mouth daily. 03/16/15   Eustaquio Maize, MD  gabapentin (NEURONTIN) 300 MG capsule TAKE 1 CAPSULE (300 MG TOTAL) BY MOUTH 2 (TWO) TIMES DAILY. 03/22/15   Historical Provider, MD  ondansetron (ZOFRAN) 4 MG tablet Take 1 tablet (4 mg total) by mouth every 8 (eight) hours as needed for nausea or vomiting. 04/05/15   Eustaquio Maize, MD  pantoprazole (PROTONIX) 40 MG tablet Take 1 tablet (40 mg total) by mouth daily. 02/14/15   Jerene Bears, MD  rizatriptan (MAXALT) 10 MG tablet TAKE 1 TABLET (10 MG  TOTAL) BY MOUTH AS NEEDED FOR MIGRAINE. MAY REPEAT IN 2 HOURS IF NEEDED 04/23/15   Mary-Margaret Hassell Done, FNP  sucralfate (CARAFATE) 1 g tablet Take 1 tablet (1 g total) by mouth 4 (four) times daily -  with meals and at bedtime. 05/10/15   Timmothy Euler, MD  Vitamin D, Ergocalciferol, (DRISDOL) 50000 units CAPS capsule Take 1 capsule (50,000 Units total) by mouth every 7 (seven) days. 04/09/15   Eustaquio Maize, MD   BP 128/85 mmHg  Pulse 68  Temp(Src) 97.8 F (36.6 C) (Oral)  Resp 18  Ht 5\' 2"  (1.575 m)  Wt 165 lb (74.844 kg)  BMI 30.17 kg/m2  SpO2 98%  LMP 04/07/2015 Physical Exam  Constitutional: She is oriented to person, place, and time.  She appears well-developed and well-nourished.  HENT:  Head: Normocephalic and atraumatic.  Eyes: Pupils are equal, round, and reactive to light.  Generally poor dentition  Cardiovascular: Normal rate, regular rhythm and normal heart sounds.   No murmur heard. Pulmonary/Chest: Effort normal and breath sounds normal. No respiratory distress. She has no wheezes.  Abdominal: Soft. Bowel sounds are normal. There is no tenderness. There is no rebound.  Musculoskeletal: She exhibits no edema.  Neurological: She is alert and oriented to person, place, and time.  Patient appears to smile symmetrically but difficult to assess secondary to "numbness" per the patient, drift noted on the right with upper and lower extremity, 4+ out of 5 right lower extremity strength, 5 out of 5 right upper extremity strength, no dysmetria to finger-nose-finger  Skin: Skin is warm and dry.  Psychiatric: She has a normal mood and affect.  Nursing note and vitals reviewed.   ED Course  Procedures (including critical care time) DIAGNOSTIC STUDIES: Oxygen Saturation is 98% on RA, normal by my interpretation.    COORDINATION OF CARE: 11:44 PM Discussed treatment plan with pt at bedside and pt agreed to plan.   Labs Review Labs Reviewed  COMPREHENSIVE METABOLIC PANEL - Abnormal; Notable for the following:    Calcium 8.8 (*)    All other components within normal limits  ETHANOL  PROTIME-INR  APTT  CBC  DIFFERENTIAL  URINE RAPID DRUG SCREEN, HOSP PERFORMED  URINALYSIS, ROUTINE W REFLEX MICROSCOPIC (NOT AT [facility])  I-STAT CHEM 8, ED  I-STAT TROPOININ, ED    Imaging Review No results found. I have personally reviewed and evaluated these images and lab results as part of my medical decision-making.   EKG Interpretation   Date/Time:  Tuesday May 22 2015 23:08:38 EDT Ventricular Rate:  74 PR Interval:  169 QRS Duration: 114 QT Interval:  408 QTC Calculation: 453 R Axis:   56 Text Interpretation:  Sinus  rhythm Incomplete right bundle branch block  Low voltage, extremity and precordial leads No significant change since  last tracing Confirmed by Kejon Feild  MD, Delaynie Stetzer ([id number]) on 05/23/2015  12:05:35 AM      MDM   Final diagnoses:  Stroke-like symptom  Other chest pain    Patient presents with chest pain and right-sided numbness/weakness. Nontoxic on exam. Afebrile. Vital signs otherwise reassuring. She does have right-sided drift and weakness right lower extremity greater than right upper extremity. Also difficult to assess cranial nerves secondary to "weakness." Code stroke was initiated by my colleague. Teleneurology evaluated the patient. She is not a TPA candidate given her symptoms last week and her reports that they did not resolve.  Recommending MRI/MRA at admission.  Lab work reassuring.  I personally performed the  services described in this documentation, which was scribed in my presence. The recorded information has been reviewed and is accurate.      Merryl Hacker, MD 05/23/15 [phone]

## 2015-05-22 NOTE — ED Notes (Signed)
EMS called out to house for reports of substernal chest pain. Pt also c/o r arm weakness and r facial droop. Pt states that she was told by her MD that she has been having "mini strokes all week". Per EMS, pt can move r arm without difficulty at times. EMS encoded on way to hospital and spoke with Dr. Dewayne Hatch who allowed pt to have ASA. EMS gave pt 324mg  ASA PTA.

## 2015-05-23 ENCOUNTER — Observation Stay (HOSPITAL_COMMUNITY): Payer: BLUE CROSS/BLUE SHIELD

## 2015-05-23 ENCOUNTER — Ambulatory Visit (HOSPITAL_COMMUNITY): Admission: RE | Admit: 2015-05-23 | Payer: BLUE CROSS/BLUE SHIELD | Source: Ambulatory Visit

## 2015-05-23 ENCOUNTER — Encounter (HOSPITAL_COMMUNITY): Payer: Self-pay

## 2015-05-23 ENCOUNTER — Observation Stay (HOSPITAL_BASED_OUTPATIENT_CLINIC_OR_DEPARTMENT_OTHER): Payer: BLUE CROSS/BLUE SHIELD

## 2015-05-23 DIAGNOSIS — M6289 Other specified disorders of muscle: Secondary | ICD-10-CM | POA: Diagnosis not present

## 2015-05-23 DIAGNOSIS — R531 Weakness: Secondary | ICD-10-CM | POA: Insufficient documentation

## 2015-05-23 DIAGNOSIS — F411 Generalized anxiety disorder: Secondary | ICD-10-CM

## 2015-05-23 DIAGNOSIS — R079 Chest pain, unspecified: Secondary | ICD-10-CM | POA: Diagnosis not present

## 2015-05-23 LAB — LIPID PANEL
CHOL/HDL RATIO: 3.2 ratio
Cholesterol: 144 mg/dL (ref 0–200)
HDL: 45 mg/dL (ref >40)
LDL Cholesterol: 89 mg/dL (ref 0–99)
Triglycerides: 49 mg/dL (ref <150)
VLDL: 10 mg/dL (ref 0–40)

## 2015-05-23 LAB — ECHOCARDIOGRAM COMPLETE
HEIGHTINCHES: 62 in
WEIGHTICAEL: 2640 [oz_av]

## 2015-05-23 MED ORDER — STROKE: EARLY STAGES OF RECOVERY BOOK
Freq: Once | Status: AC
  Administered 2015-05-23: 10:00:00
  Filled 2015-05-23: qty 1

## 2015-05-23 NOTE — Progress Notes (Signed)
Physical Therapy Treatment Patient Details Name: Heather [name] MRN: [id number] DOB: [birth year] Today's Date: 05/23/2015    History of Present Illness 50 yo F admitted with c/o R UE and facial numbness with recent episode of the same thing last week.  Head CT done on 5/16 was (-), and MRI done 5/17 was also (-).  PMH: anxiety, emphysema, GERD, stroke, CA- colon, external ear surgery, colon surgery, skin graft.     PT Comments    Pt received in bed, and was agreeable to PT tx.  Pt able to ambulate 558ft with no AD, and supervision/min guard, negotiated 1 flight of steps with supervision.  During gait, pt c/o increased pain down R LE and pt stated that she has a history of lower back issues.  Upon return to the room, had pt perform prone press ups.  Afterwards, pt still with c/o R LE pain, but also pain in lumbar spine.  Possible centralization of symptoms?  Continue to recommend OPPT for balance and strengthening.    Follow Up Recommendations  Outpatient PT     Equipment Recommendations  None recommended by PT    Recommendations for Other Services       Precautions / Restrictions Precautions Precautions: None Restrictions Weight Bearing Restrictions: No    Mobility  Bed Mobility Overal bed mobility: Independent                Transfers Overall transfer level: Independent Equipment used: None Transfers: Sit to/from [employer] to Stand: Independent Stand pivot transfers: Min guard          Ambulation/Gait Ambulation/Gait assistance: Supervision;Min guard Ambulation Distance (Feet): 500 Feet Assistive device: None Gait Pattern/deviations: Step-to pattern;Step-through pattern     General Gait Details: Pt reaching for wall rail to hold on to, and varying gait pattern with step-to pattern vs step through pattern.  Pt holding UE's very stiffly by her side, cues to allow them to swing, and she was able to perform this initially, but not able to  sustain.  Pt did not demonstrate any overt LOB during gait, but c/o increased R LE pain.     Stairs Stairs: Yes Stairs assistance: Supervision Stair Management: One rail Right;Step to pattern Number of Stairs: 10 General stair comments: step to pattern  Wheelchair Mobility    Modified Rankin (Stroke Patients Only)       Balance           Standing balance support: No upper extremity supported Standing balance-Leahy Scale: Fair Standing balance comment: Pt is guarded.                     Cognition Arousal/Alertness: Awake/alert Behavior During Therapy: WFL for tasks assessed/performed Overall Cognitive Status: Within Functional Limits for tasks assessed                      Exercises Other Exercises Other Exercises: Prone press up's in bed 2 x 10 reps - pt states that she now has pain in R LE, as well as in her lumbar spine.     General Comments        Pertinent Vitals/Pain Pain Assessment: No/denies pain Pain Location: Pt initially denies any pain, however after ambulating she c/o R LE pain, but did not rate.  After Prone press up's, pt expressed that pain had localized to lower back.      Home Living Family/patient expects to be discharged to:: Private residence Living Arrangements: Spouse/significant  other (works 2nd shift. )   Type of Home: Mobile home Home Access: Stairs to enter   Home Layout: One level [place]: None      Prior Function Level of Independence: Independent          PT Goals (current goals can now be found in the care plan section) Acute Rehab PT Goals Patient Stated Goal: To go home.  PT Goal Formulation: With patient Time For Goal Achievement: 05/30/15 Potential to Achieve Goals: Good Progress towards PT goals: Progressing toward goals    Frequency  Min 2X/week    PT Plan      Co-evaluation             End of Session   Activity Tolerance: Patient tolerated treatment well Patient left: in  bed     Time: 1401-1420 PT Time Calculation (min) (ACUTE ONLY): 19 min  Charges:  $Gait Training: 8-22 mins                    G Codes:      Beth Moo Gravley, PT, DPT X: [id number]   05/23/2015, 2:47 PM

## 2015-05-23 NOTE — Progress Notes (Signed)
Patient alert and oriented, independent, VSS, pt. Tolerating diet well. No complaints of pain or nausea. Pt. Had IV removed tip intact. Pt. Had prescriptions given. Pt. Voiced understanding of discharge instructions with no further questions. Pt. Discharged via wheelchair with auxilliary.  

## 2015-05-23 NOTE — Discharge Summary (Signed)
Physician Discharge Summary  Heather [name] [id number] DOB: [birth year] DOA: 05/22/2015  PCP: Eustaquio Maize, MD  Admit date: 05/22/2015 Discharge date: 05/23/2015  Time spent: 45 minutes  Recommendations for Outpatient Follow-up:  -We'll be discharged home today. -Advised follow-up with primary care provider in 2 weeks.   Discharge Diagnoses:  Principal Problem:   Weakness of one side of body Active Problems:   Generalized anxiety disorder   Esophageal reflux   Tobacco use disorder   Right sided weakness   Discharge Condition: Stable and improved  Filed Weights   05/22/15 2315  Weight: 74.844 kg (165 lb)    History of present illness:  As per Dr. Shanon Brow on 5/17: 50 yo female comes in with sudden onset of right arm/leg and face numbness and weak along with chest pain. Pt has similar episodes last week on 5/9 in ED please see notes in epic. Her symptoms then had resolved and today it happened again. Pt denies any recent illnesses. No fevers. Her exam is inconsistent and has poor effort. She denies any headache, no vision changes. She reports her whole right side is weak and numb. teleneuro was consulted and advised no tpa due to her symptoms she had last week. Pt referred for admission for concerns of a stroke.  Hospital Course:   Right-sided weakness -CT/MRI of the brain negative for acute CVA. -Do not believe this represents a TIA. -Patient is noted to have multiple admissions and ED visits for the same, I wonder if/what her secondary gain is. -She states she is remarkably improved today and is ready to be discharged.  Rest of chronic conditions have been stable and home medications have not been changed.  Procedures: 2-D echo:Study Conclusions  - Left ventricle: The cavity size was normal. Wall thickness was  normal. Systolic function was normal. The estimated ejection  fraction was in the range of 60% to 65%. Diastolic dysfunction,  grade  indeterminate. Wall motion was normal; there were no   regional wall motion abnormalities.   Consultations:  None  Discharge Instructions  Discharge Instructions    Diet - low sodium heart healthy    Complete by:  As directed      Increase activity slowly    Complete by:  As directed             Medication List    TAKE these medications        escitalopram 5 MG tablet  Commonly known as:  LEXAPRO  Take 1 tablet (5 mg total) by mouth daily.     gabapentin 300 MG capsule  Commonly known as:  NEURONTIN  TAKE 1 CAPSULE (300 MG TOTAL) BY MOUTH 2 (TWO) TIMES DAILY.     ondansetron 4 MG tablet  Commonly known as:  ZOFRAN  Take 1 tablet (4 mg total) by mouth every 8 (eight) hours as needed for nausea or vomiting.     pantoprazole 40 MG tablet  Commonly known as:  PROTONIX  Take 1 tablet (40 mg total) by mouth daily.     rizatriptan 10 MG tablet  Commonly known as:  MAXALT  TAKE 1 TABLET (10 MG TOTAL) BY MOUTH AS NEEDED FOR MIGRAINE. MAY REPEAT IN 2 HOURS IF NEEDED     Vitamin D (Ergocalciferol) 50000 units Caps capsule  Commonly known as:  DRISDOL  Take 1 capsule (50,000 Units total) by mouth every 7 (seven) days.       Allergies  Allergen Reactions  . Aleve [  Naproxen] Hives and Itching  . Asa [Aspirin] Hives  . Excedrin [Aspirin-Acetaminophen-Caffeine] Hives  . Vicodin [Hydrocodone-Acetaminophen] Hives       Follow-up Information    Follow up with Eustaquio Maize, MD. Schedule an appointment as soon as possible for a visit in 2 weeks.   Specialty:  Pediatrics   Contact information:   [address] [place] [id number] [phone]        The results of significant diagnostics from this hospitalization (including imaging, microbiology, ancillary and laboratory) are listed below for reference.    Significant Diagnostic Studies: Dg Chest 2 View  05/15/2015  CLINICAL DATA:  Dizziness starting this morning.  Chest pain EXAM: CHEST  2 VIEW COMPARISON:   03/19/2015 FINDINGS: The heart size and mediastinal contours are within normal limits. Both lungs are clear. The visualized skeletal structures are unremarkable. IMPRESSION: No active cardiopulmonary disease. Electronically Signed   By: Van Clines M.D.   On: 05/15/2015 17:39   Ct Head Wo Contrast  05/23/2015  CLINICAL DATA:  Code stroke.  Sudden onset right-sided weakness. EXAM: CT HEAD WITHOUT CONTRAST TECHNIQUE: Contiguous axial images were obtained from the base of the skull through the vertex without intravenous contrast. COMPARISON:  05/15/2015 FINDINGS: Ventricles and sulci appear symmetrical. No ventricular dilatation. Calcification in the deep white matter of the right anterior frontal lobe without change since prior study, likely dystrophic. No mass effect or midline shift. No abnormal extra-axial fluid collections. Gray-white matter junctions are distinct. Basal cisterns are not effaced. No evidence of acute intracranial hemorrhage. No depressed skull fractures. Small left mastoid effusion. Paranasal sinuses are clear. IMPRESSION: No acute intracranial abnormalities. These results were called by telephone at the time of interpretation on 05/22/2015 at 2307 hours to Dr. Milton Ferguson , who verbally acknowledged these results. Electronically Signed   By: Lucienne Capers M.D.   On: 05/23/2015 00:23   Ct Head Wo Contrast  05/15/2015  CLINICAL DATA:  Dizziness starting this morning. Numbness in the mouth starting at 3:30 p.m. Chest pain. EXAM: CT HEAD WITHOUT CONTRAST TECHNIQUE: Contiguous axial images were obtained from the base of the skull through the vertex without intravenous contrast. COMPARISON:  03/19/2015 FINDINGS: Stable oval-shaped calcification in the right frontal lobe measuring 6 mm in long axis on image 14/2, no change from the prior exam. Otherwise, the brainstem, cerebellum, cerebral peduncles, thalami, basal ganglia, basilar cisterns, and ventricular system appear within normal  limits. No intracranial hemorrhage, mass lesion, or acute CVA. Small left mastoid effusion. No middle ear fluid. IMPRESSION: 1. Small left mastoid effusion. 2. Oval-shaped calcification in the right frontal lobe along the periventricular white matter, stable and probably from remote inflammation. There is no surrounding vasogenic edema or mass lesion identified. Electronically Signed   By: Van Clines M.D.   On: 05/15/2015 17:53   Mr Virgel Paling Wo Contrast  05/23/2015  CLINICAL DATA:  50 year old female with right side weakness and dizziness for 1 day. Initial encounter. EXAM: MRI HEAD WITHOUT CONTRAST MRA HEAD WITHOUT CONTRAST TECHNIQUE: Multiplanar, multiecho pulse sequences of the brain and surrounding structures were obtained without intravenous contrast. Angiographic images of the head were obtained using MRA technique without contrast. COMPARISON:  Head CTs 05/22/2015 and earlier. FINDINGS: MRI HEAD FINDINGS Cerebral volume is normal. No restricted diffusion to suggest acute infarction. No midline shift, mass effect, evidence of mass lesion, ventriculomegaly, extra-axial collection or acute intracranial hemorrhage. Cervicomedullary junction and pituitary are within normal limits. Negative visualized cervical spine Major intracranial vascular  flow voids are within normal limits. Re- demonstrated nonspecific dystrophic calcification in the right frontal lobe white matter near the frontal horn (series 9, image 14). Otherwise normal for age gray and white matter signal throughout the brain. No cortical encephalomalacia. Deep gray matter nuclei, brainstem, and cerebellum appear normal. Visible internal auditory structures appear normal. There is a chronic left mastoid effusion, mild. Negative nasopharynx. Right mastoids are clear. There is moderate left maxillary sinus mucosal thickening, other paranasal sinuses are clear. Negative orbit and scalp soft tissues. MRA HEAD FINDINGS Antegrade flow in the  posterior circulation with dominant distal left vertebral artery. Both PICA origins are normal. No distal vertebral stenosis. No basilar artery stenosis. Normal SCA and PCA origins. Both posterior communicating arteries are present, the right is larger. Bilateral PCA branches are within normal limits. Antegrade flow in both ICA siphons. No siphon stenosis. Normal ophthalmic and posterior communicating artery origins (small infundibulum of the left Pcomm). Normal carotid termini. Normal MCA and ACA origins. Anterior communicating artery and visualized ACA branches are within normal limits (the left A2 is dominant). Bilateral MCA M1 segments, MCA bifurcations, and visualized bilateral MCA branches are within normal limits. IMPRESSION: 1. No acute intracranial abnormality and negative noncontrast MRI appearance the brain. 2.  Negative intracranial MRA. 3. Mild chronic left mastoid effusion, most often postinflammatory. Electronically Signed   By: Genevie Ann M.D.   On: 05/23/2015 10:57   Mr Brain Wo Contrast  05/23/2015  CLINICAL DATA:  50 year old female with right side weakness and dizziness for 1 day. Initial encounter. EXAM: MRI HEAD WITHOUT CONTRAST MRA HEAD WITHOUT CONTRAST TECHNIQUE: Multiplanar, multiecho pulse sequences of the brain and surrounding structures were obtained without intravenous contrast. Angiographic images of the head were obtained using MRA technique without contrast. COMPARISON:  Head CTs 05/22/2015 and earlier. FINDINGS: MRI HEAD FINDINGS Cerebral volume is normal. No restricted diffusion to suggest acute infarction. No midline shift, mass effect, evidence of mass lesion, ventriculomegaly, extra-axial collection or acute intracranial hemorrhage. Cervicomedullary junction and pituitary are within normal limits. Negative visualized cervical spine Major intracranial vascular flow voids are within normal limits. Re- demonstrated nonspecific dystrophic calcification in the right frontal lobe white  matter near the frontal horn (series 9, image 14). Otherwise normal for age gray and white matter signal throughout the brain. No cortical encephalomalacia. Deep gray matter nuclei, brainstem, and cerebellum appear normal. Visible internal auditory structures appear normal. There is a chronic left mastoid effusion, mild. Negative nasopharynx. Right mastoids are clear. There is moderate left maxillary sinus mucosal thickening, other paranasal sinuses are clear. Negative orbit and scalp soft tissues. MRA HEAD FINDINGS Antegrade flow in the posterior circulation with dominant distal left vertebral artery. Both PICA origins are normal. No distal vertebral stenosis. No basilar artery stenosis. Normal SCA and PCA origins. Both posterior communicating arteries are present, the right is larger. Bilateral PCA branches are within normal limits. Antegrade flow in both ICA siphons. No siphon stenosis. Normal ophthalmic and posterior communicating artery origins (small infundibulum of the left Pcomm). Normal carotid termini. Normal MCA and ACA origins. Anterior communicating artery and visualized ACA branches are within normal limits (the left A2 is dominant). Bilateral MCA M1 segments, MCA bifurcations, and visualized bilateral MCA branches are within normal limits. IMPRESSION: 1. No acute intracranial abnormality and negative noncontrast MRI appearance the brain. 2.  Negative intracranial MRA. 3. Mild chronic left mastoid effusion, most often postinflammatory. Electronically Signed   By: Genevie Ann M.D.   On: 05/23/2015 10:57  Microbiology: No results found for this or any previous visit (from the past 240 hour(s)).   Labs: Basic Metabolic Panel:  Recent Labs Lab 05/22/15 2258 05/22/15 2324  NA 136 141  K 3.8 3.8  CL 104 103  CO2 24  --   GLUCOSE 96 88  BUN 14 13  CREATININE 0.70 0.80  CALCIUM 8.8*  --    Liver Function Tests:  Recent Labs Lab 05/22/15 2258  AST 17  ALT 18  ALKPHOS 74  BILITOT 0.5    PROT 7.1  ALBUMIN 4.0   No results for input(s): LIPASE, AMYLASE in the last 168 hours. No results for input(s): AMMONIA in the last 168 hours. CBC:  Recent Labs Lab 05/22/15 2258 05/22/15 2324  WBC 7.7  --   NEUTROABS 5.0  --   HGB 13.3 14.3  HCT 38.3 42.0  MCV 87.8  --   PLT 235  --    Cardiac Enzymes: No results for input(s): CKTOTAL, CKMB, CKMBINDEX, TROPONINI in the last 168 hours. BNP: BNP (last 3 results) No results for input(s): BNP in the last 8760 hours.  ProBNP (last 3 results) No results for input(s): PROBNP in the last 8760 hours.  CBG: No results for input(s): GLUCAP in the last 168 hours.     SignedLelon Frohlich  [facility] Pager: [phone] 05/23/2015, 4:08 PM

## 2015-05-23 NOTE — H&P (Signed)
PCP:   Eustaquio Maize, MD   Chief Complaint:  Right sided weak  HPI: 50 yo female comes in with sudden onset of right arm/leg and face numbness and weak along with chest pain.  Pt has similar episodes last week on 5/9 in ED please see notes in epic.  Her symptoms then had resolved and today it happened again.  Pt denies any recent illnesses.  No fevers.  Her exam is inconsistent and has poor effort.  She denies any headache, no vision changes.  She reports her whole right side is weak and numb.  teleneuro was consulted and advised no tpa due to her symptoms she had last week.  Pt referred for admission for concerns of a stroke.  Review of Systems:  Positive and negative as per HPI otherwise all other systems are negative  Past Medical History: Past Medical History  Diagnosis Date  . Anxiety   . Emphysema of lung ([place])   . GERD (gastroesophageal reflux disease)   . Stroke ([place])   . Cancer [facility])     colon   Past Surgical History  Procedure Laterality Date  . External ear surgery    . Skin graft    . Colon surgery      Medications: Prior to Admission medications   Medication Sig Start Date End Date Taking? Authorizing Provider  escitalopram (LEXAPRO) 5 MG tablet Take 1 tablet (5 mg total) by mouth daily. 03/16/15   Eustaquio Maize, MD  gabapentin (NEURONTIN) 300 MG capsule TAKE 1 CAPSULE (300 MG TOTAL) BY MOUTH 2 (TWO) TIMES DAILY. 03/22/15   Historical Provider, MD  ondansetron (ZOFRAN) 4 MG tablet Take 1 tablet (4 mg total) by mouth every 8 (eight) hours as needed for nausea or vomiting. 04/05/15   Eustaquio Maize, MD  pantoprazole (PROTONIX) 40 MG tablet Take 1 tablet (40 mg total) by mouth daily. 02/14/15   Jerene Bears, MD  rizatriptan (MAXALT) 10 MG tablet TAKE 1 TABLET (10 MG TOTAL) BY MOUTH AS NEEDED FOR MIGRAINE. MAY REPEAT IN 2 HOURS IF NEEDED 04/23/15   Mary-Margaret Hassell Done, FNP  sucralfate (CARAFATE) 1 g tablet Take 1 tablet (1 g total) by mouth 4 (four) times daily -  with  meals and at bedtime. 05/10/15   Timmothy Euler, MD  Vitamin D, Ergocalciferol, (DRISDOL) 50000 units CAPS capsule Take 1 capsule (50,000 Units total) by mouth every 7 (seven) days. 04/09/15   Eustaquio Maize, MD    Allergies:   Allergies  Allergen Reactions  . Aleve [Naproxen] Hives and Itching  . Asa [Aspirin] Hives  . Excedrin [Aspirin-Acetaminophen-Caffeine] Hives  . Vicodin [Hydrocodone-Acetaminophen] Hives    Social History:  reports that she has been smoking Cigarettes.  She has been smoking about 1.00 pack per day. She has never used smokeless tobacco. She reports that she does not drink alcohol or use illicit drugs.  Family History: Family History  Problem Relation Age of Onset  . Heart failure Mother   . Colon cancer Mother 3  . Bladder Cancer Mother   . Heart disease Father   . Breast cancer Sister   . Heart disease Maternal Aunt   . Heart disease Maternal Uncle   . Colon cancer Paternal Aunt 42    Physical Exam: Filed Vitals:   05/22/15 2350 05/22/15 2354 05/23/15 0000 05/23/15 0015  BP:   126/82 119/79  Pulse: 74 74 74 70  Temp:      TempSrc:  Resp: 16 16 23 12   Height:      Weight:      SpO2: 99% 98% 98% 98%   General appearance: alert, cooperative and no distress Head: Normocephalic, without obvious abnormality, atraumatic Eyes: negative Nose: Nares normal. Septum midline. Mucosa normal. No drainage or sinus tenderness. Neck: no JVD and supple, symmetrical, trachea midline Lungs: clear to auscultation bilaterally Heart: regular rate and rhythm, S1, S2 normal, no murmur, click, rub or gallop Abdomen: soft, non-tender; bowel sounds normal; no masses,  no organomegaly Extremities: extremities normal, atraumatic, no cyanosis or edema Pulses: 2+ and symmetric Skin: Skin color, texture, turgor normal. No rashes or lesions Neurologic: Mental status: Alert, oriented, thought content appropriate Cranial nerves: normal  3/5 strength in rue and rle normal  on the left.  Overall poor effort noted on exam   Labs on Admission:   Recent Labs  05/22/15 2258 05/22/15 2324  NA 136 141  K 3.8 3.8  CL 104 103  CO2 24  --   GLUCOSE 96 88  BUN 14 13  CREATININE 0.70 0.80  CALCIUM 8.8*  --     Recent Labs  05/22/15 2258  AST 17  ALT 18  ALKPHOS 74  BILITOT 0.5  PROT 7.1  ALBUMIN 4.0    Recent Labs  05/22/15 2258 05/22/15 2324  WBC 7.7  --   NEUTROABS 5.0  --   HGB 13.3 14.3  HCT 38.3 42.0  MCV 87.8  --   PLT 235  --     Radiological Exams on Admission Ct Head Wo Contrast  05/23/2015  CLINICAL DATA:  Code stroke.  Sudden onset right-sided weakness. EXAM: CT HEAD WITHOUT CONTRAST TECHNIQUE: Contiguous axial images were obtained from the base of the skull through the vertex without intravenous contrast. COMPARISON:  05/15/2015 FINDINGS: Ventricles and sulci appear symmetrical. No ventricular dilatation. Calcification in the deep white matter of the right anterior frontal lobe without change since prior study, likely dystrophic. No mass effect or midline shift. No abnormal extra-axial fluid collections. Gray-white matter junctions are distinct. Basal cisterns are not effaced. No evidence of acute intracranial hemorrhage. No depressed skull fractures. Small left mastoid effusion. Paranasal sinuses are clear. IMPRESSION: No acute intracranial abnormalities. These results were called by telephone at the time of interpretation on 05/22/2015 at 2307 hours to Dr. Milton Ferguson , who verbally acknowledged these results. Electronically Signed   By: Lucienne Capers M.D.   On: 05/23/2015 00:23   ekg reviewed nsr Old chart reviewed Case discussed with dr Dina Rich  Assessment/Plan  50 yo female with right sided weakness concerning for possible CVA  Principal Problem:   Weakness of one side of body- given aspirin in the ambulance, report as allergy is hives in chart.  Obtain mri/a head and neck along with cardiac echo.  Serial troponin.  If  indeed does have stroke, consider plavix.  freq neuro checks.  If work up negative consider conversion disorder.  Active Problems:   Generalized anxiety disorder- noted   Esophageal reflux- noted   Tobacco use disorder- noted  obs on tele. Full code.  Marshel Golubski A 05/23/2015, 3:46 AM

## 2015-05-23 NOTE — Evaluation (Signed)
Physical Therapy Evaluation Patient Details Name: Heather [name] MRN: [id number] DOB: [birth year] Today's Date: 05/23/2015   History of Present Illness  50 yo F admitted with c/o R UE and facial numbness with recent episode of the same thing last week.  Head CT done on 5/16 was (-), and MRI done 5/17 was also (-).  PMH: anxiety, emphysema, GERD, stroke, CA- colon, external ear surgery, colon surgery, skin graft.   Clinical Impression  Pt observed from hallway using both hands to text and play a game on her smart phone.  Upon entering the room, pt was in bed, and was agreeable to PT evaluation.  Pt states that PTA she was independent with ADL's an IADL's, she lives with her husband, and works at [employer]  She does not drive, but her sister drives.  Pt was able to ambulate 74ft with no AD, and supervision.  Limited evaluation due to transport arriving to take her for MRI.  Will return later today to complete full assessment.  Likely recommend for OPPT.     Follow Up Recommendations Outpatient PT    Equipment Recommendations  None recommended by PT    Recommendations for Other Services       Precautions / Restrictions Precautions Precautions: None Restrictions Weight Bearing Restrictions: No      Mobility  Bed Mobility Overal bed mobility: Modified Independent                Transfers Overall transfer level: Needs assistance Equipment used: None Transfers: Sit to/from Stand;Stand Pivot Transfers Sit to Stand: Supervision Stand pivot transfers: Min guard          Ambulation/Gait Ambulation/Gait assistance: Supervision Ambulation Distance (Feet): 5 Feet Assistive device: None Gait Pattern/deviations: Step-to pattern     General Gait Details: pt reaching for objects in the room to hold onto.  Further PT tx limited due to transport arriving to take pt for MRI.   Stairs            Wheelchair Mobility    Modified Rankin (Stroke Patients Only)       Balance                                              Pertinent Vitals/Pain Pain Assessment: No/denies pain    Home Living Family/patient expects to be discharged to:: Private residence Living Arrangements: Spouse/significant other (works 2nd shift. )   Type of Home: Mobile home Home Access: Stairs to enter   Technical brewer of Steps: 5 with HR Home Layout: One level Home Equipment: None      Prior Function Level of Independence: Independent               Hand Dominance        Extremity/Trunk Assessment               Lower Extremity Assessment: Generalized weakness         Communication   Communication: No difficulties  Cognition Arousal/Alertness: Awake/alert Behavior During Therapy: WFL for tasks assessed/performed Overall Cognitive Status: Within Functional Limits for tasks assessed                      General Comments      Exercises        Assessment/Plan    PT Assessment Patient needs continued PT services  PT Diagnosis  Difficulty walking   PT Problem List Decreased balance;Decreased activity tolerance  PT Treatment Interventions Gait training;Stair training;Functional mobility training;Therapeutic activities;Therapeutic exercise;Balance training;Patient/family education   PT Goals (Current goals can be found in the Care Plan section) Acute Rehab PT Goals Patient Stated Goal: To go home.  PT Goal Formulation: With patient Time For Goal Achievement: 05/30/15 Potential to Achieve Goals: Good    Frequency Min 2X/week   Barriers to discharge        Co-evaluation               End of Session   Activity Tolerance: Patient tolerated treatment well Patient left: in chair (with transport) Nurse Communication: Mobility status         Time: [id number] PT Time Calculation (min) (ACUTE ONLY): 11 min   Charges:   PT Evaluation $PT Eval Moderate Complexity: 1 Procedure     PT G Codes:        Beth  Olar Santini, PT, DPT X: [id number]   05/23/2015, 2:35 PM

## 2015-05-23 NOTE — Progress Notes (Signed)
Beeped at 2149 Pt. In room at 2155 Pt. Out of room at 2200 Rad called at 2159  When radiologist called I spoke with [facility].   She said that the radiologist would not look at it without an order.   I told her that it was a code stroke and that it needed looked at immediately.  Pt. Was not registered yet and came straight from EMS.

## 2015-05-23 NOTE — Progress Notes (Signed)
*  PRELIMINARY RESULTS* Echocardiogram 2D Echocardiogram has been performed.  Leavy Cella 05/23/2015, 12:33 PM

## 2015-05-23 NOTE — Evaluation (Signed)
Clinical/Bedside Swallow Evaluation Patient Details  Name: Heather [name] MRN: [id number] Date of Birth: 03-12-1965  Today's Date: 05/23/2015 Time: SLP Start Time (ACUTE ONLY): [id number] SLP Stop Time (ACUTE ONLY): 1439 SLP Time Calculation (min) (ACUTE ONLY): 17 min  Past Medical History:  Past Medical History  Diagnosis Date  . Anxiety   . Emphysema of lung ([place])   . GERD (gastroesophageal reflux disease)   . Stroke ([place])   . Cancer [facility])     colon   Past Surgical History:  Past Surgical History  Procedure Laterality Date  . External ear surgery    . Skin graft    . Colon surgery     HPI:  Pt is a 50 yo female with sudden onset of right arm/leg and face numbness and weak along with chest pain. Pt has had similar episodes last week on 5/9 in ED. Current neuorological imaging, CT and MRI of head, both negative for acute intracranial abnormality. ST consulted for bedside swallow exam secondary to failing RN stroke swallow screen   Assessment / Plan / Recommendation Clinical Impression  Pts swallow appears within functional limits. Oral motor exam unremarkable. Pt with one instance of intermittent wet vocal quality following solid PO, which cleared with pts reflexive additonal swallow. No overt signs or symptoms of aspiration any PO. Current neurological imaging also negative for acute intracranial abnormalities. Recommend regular thin diet initiation with medicines whole with thin liquids. No further swallow needs identified.     Aspiration Risk  Mild aspiration risk    Diet Recommendation   Regular, thin liquids   Medication Administration: Whole meds with liquid    Other  Recommendations Oral Care Recommendations: Oral care BID   Follow up Recommendations       Frequency and Duration            Prognosis        Swallow Study   General Date of Onset: 05/22/15 HPI: Pt is a 50 yo female with sudden onset of right arm/leg and face numbness and weak along with chest  pain. Pt has had similar episodes last week on 5/9 in ED. Current neuorological imaging, CT and MRI of head, both negative for acute intracranial abnormality. ST consulted for bedside swallow exam secondary to failing RN stroke swallow screen Type of Study: Bedside Swallow Evaluation Previous Swallow Assessment: none on file Diet Prior to this Study: NPO Temperature Spikes Noted: No Respiratory Status: Room air History of Recent Intubation: No Behavior/Cognition: Alert Oral Cavity Assessment: Within Functional Limits Oral Cavity - Dentition: Adequate natural dentition Vision: Functional for self-feeding Self-Feeding Abilities: Able to feed self Patient Positioning: Upright in bed Baseline Vocal Quality: Low vocal intensity Volitional Cough: Strong Volitional Swallow: Able to elicit    Oral/Motor/Sensory Function Overall Oral Motor/Sensory Function: Within functional limits   Ice Chips Ice chips: Not tested   Thin Liquid Thin Liquid: Within functional limits Presentation: Cup;Straw    Nectar Thick Nectar Thick Liquid: Not tested   Honey Thick Honey Thick Liquid: Not tested   Puree Puree: Within functional limits   Solid   GO   Solid: Impaired Presentation: Self Fed Oral Phase Functional Implications: Prolonged oral transit Pharyngeal Phase Impairments: Wet Vocal Quality;Multiple swallows    Functional Assessment Tool Used: skilled observation Functional Limitations: Swallowing Swallow Current Status [id number]): At least 1 percent but less than 20 percent impaired, limited or restricted Swallow Goal Status [phone]): At least 1 percent but less than 20 percent impaired, limited or restricted  Swallow Discharge Status [id number]): At least 1 percent but less than 20 percent impaired, limited or restricted    Arvil Chaco MA, [place]    Arvil Chaco E 05/23/2015,2:45 PM

## 2015-05-24 LAB — HEMOGLOBIN A1C
Hgb A1c MFr Bld: 4.9 % (ref 4.8–5.6)
Mean Plasma Glucose: 94 mg/dL

## 2015-05-24 NOTE — Care Management Note (Signed)
Case Management Note  Patient Details  Name: Heather [name] MRN: [id number] Date of Birth: 02/19/1965  Subjective/Objective:                  Pt is from home, lives with her husband and is ind with ADL's. Pt has DME prior to admission. Pt has PCP, transportation and no difficulty affording her medications. PT has recommended OP PT and pt is agreeable. Pt wishes to go to the OP rehab center in [place]. Pt info and referral will be faxed to that OP facility.   Action/Plan: Pt discharging home on 05/23/2015. No further CM needs.   Expected Discharge Date:   05/23/2015               Expected Discharge Plan:  Home/Self Care  In-House Referral:  NA  Discharge planning Services  CM Consult  Post Acute Care Choice:  NA Choice offered to:  NA  DME Arranged:    DME Agency:     HH Arranged:    HH Agency:     Status of Service:  Completed, signed off  Medicare Important Message Given:    Date Medicare IM Given:    Medicare IM give by:    Date Additional Medicare IM Given:    Additional Medicare Important Message give by:     If discussed at [place] of Stay Meetings, dates discussed:    Additional Comments:  Sherald Barge, RN 05/24/2015, 7:12 AM

## 2015-05-28 ENCOUNTER — Ambulatory Visit (INDEPENDENT_AMBULATORY_CARE_PROVIDER_SITE_OTHER): Payer: BLUE CROSS/BLUE SHIELD | Admitting: Family Medicine

## 2015-05-28 ENCOUNTER — Encounter: Payer: Self-pay | Admitting: Family Medicine

## 2015-05-28 VITALS — BP 125/83 | HR 89 | Temp 97.6°F | Ht 62.0 in | Wt 168.0 lb

## 2015-05-28 DIAGNOSIS — M6289 Other specified disorders of muscle: Secondary | ICD-10-CM

## 2015-05-28 DIAGNOSIS — R202 Paresthesia of skin: Secondary | ICD-10-CM | POA: Diagnosis not present

## 2015-05-28 DIAGNOSIS — R531 Weakness: Secondary | ICD-10-CM

## 2015-05-28 DIAGNOSIS — R200 Anesthesia of skin: Secondary | ICD-10-CM | POA: Insufficient documentation

## 2015-05-28 NOTE — Progress Notes (Signed)
   HPI  Patient presents today here for hospital follow-up.  Patient comes in stating that she had a stroke. On review of the hospital discharge summary they did not feel that she had a stroke or TIA. She had a CT scan, MRI, MRA which were all normal. She states that she has a history of recent stroke, about one month ago.  She states the symptoms she had preceding hospitalization for right-sided weakness, numbness, dizziness, and cold sweats. She states that her weakness and numbness are improving, however they do persist. She's walking normally.  She requests a note for work and states that she needs to stay out until her weakness is better.  Her abdominal pain has resolved.   PMH: Smoking status noted ROS: Per HPI  Objective: BP 125/83 mmHg  Pulse 89  Temp(Src) 97.6 F (36.4 C) (Oral)  Ht 5\' 2"  (1.575 m)  Wt 168 lb (76.204 kg)  BMI 30.72 kg/m2  LMP 04/07/2015 Gen: NAD, alert, cooperative with exam HEENT: NCAT, PERRL CV: RRR, good S1/S2, no murmur Resp: CTABL, no wheezes, non-labored Abd: SNTND, BS present, no guarding or organomegaly Ext: No edema, warm Neuro: Alert and oriented, strength 5/5 and sensation intact in the left upper and left lower extremity, right lower and right upper extremity with 3/5 strength and subjective numbness. Patellar tendon reflexes diminished bilaterally Brachioradialis and triceps reflexes normal bilaterally States that right side of face has no sensation Tongue midline   Recent hospital workup includes: CT head normal MRI/MRA head normal/no acute Echocardiogram- normal Lipid panel and A1c normal Negative troponins   Assessment and plan:  # Right-sided numbness, right-sided weakness Referring to neurology, CT and MRI of the brain unrevealing Hospitalist suspecting secondary gain, however that is unclear what she would be after. Recommend daily aspirin Note written for work for 2 weeks Cholesterol and A1c normal PT  ordered Return to clinic in 2 weeks  # Smoking recommended quitting     Orders Placed This Encounter  Procedures  . Ambulatory referral to Neurology    Referral Priority:  Routine    Referral Type:  Consultation    Referral Reason:  Specialty Services Required    Requested Specialty:  Neurology    Number of Visits Requested:  1  . Ambulatory referral to Physical Therapy    Referral Priority:  Routine    Referral Type:  Physical Medicine    Referral Reason:  Specialty Services Required    Requested Specialty:  Physical Therapy    Number of Visits Requested:  Jenkins, MD [facility] 05/28/2015, 3:20 PM

## 2015-05-28 NOTE — Patient Instructions (Addendum)
Great to see you!  I have arranged for a neurology appointment for you  Please get medical help right away for worsening or new weakness or numbness

## 2015-05-30 ENCOUNTER — Encounter: Payer: BLUE CROSS/BLUE SHIELD | Admitting: Internal Medicine

## 2015-06-05 ENCOUNTER — Ambulatory Visit: Payer: BLUE CROSS/BLUE SHIELD | Admitting: Physical Therapy

## 2015-06-05 ENCOUNTER — Other Ambulatory Visit: Payer: Self-pay | Admitting: Pediatrics

## 2015-06-06 NOTE — Telephone Encounter (Signed)
Last seen 05/28/15 Dr Wendi Snipes  Last Vit D level 04/05/15  23.7

## 2015-06-07 NOTE — Telephone Encounter (Signed)
Changing to daily replacement 1000 to 2000 IU of vitamin D3 daily recommended. This is consitent with her PCP's plan and I agree.   Laroy Apple, MD [facility] 06/07/2015, 8:12 AM

## 2015-06-11 ENCOUNTER — Telehealth: Payer: Self-pay | Admitting: Family Medicine

## 2015-06-11 ENCOUNTER — Ambulatory Visit: Payer: BLUE CROSS/BLUE SHIELD | Attending: Family Medicine | Admitting: Physical Therapy

## 2015-06-11 VITALS — BP 124/80

## 2015-06-11 DIAGNOSIS — R2689 Other abnormalities of gait and mobility: Secondary | ICD-10-CM | POA: Diagnosis present

## 2015-06-11 DIAGNOSIS — R278 Other lack of coordination: Secondary | ICD-10-CM | POA: Insufficient documentation

## 2015-06-11 DIAGNOSIS — M6281 Muscle weakness (generalized): Secondary | ICD-10-CM | POA: Insufficient documentation

## 2015-06-11 NOTE — Therapy (Signed)
[facility] [place], [place], [address] Phone: [phone]   Fax:  [phone]  Physical Therapy Evaluation  Patient Details  Name: Heather [name] MRN: [id number] Date of Birth: 1965/04/26 Referring Provider: Kenn File MD.  Encounter Date: 06/11/2015      PT End of Session - 06/11/15 1220    Visit Number 1   Number of Visits 16   Date for PT Re-Evaluation 08/06/15   PT Start Time 1109   PT Stop Time 1149   PT Time Calculation (min) 40 min   Activity Tolerance Patient limited by fatigue   Behavior During Therapy [facility] for tasks assessed/performed      Past Medical History  Diagnosis Date  . Anxiety   . Emphysema of lung ([place])   . GERD (gastroesophageal reflux disease)   . Stroke ([place])   . Cancer [facility])     colon    Past Surgical History  Procedure Laterality Date  . External ear surgery    . Skin graft    . Colon surgery      Filed Vitals:   06/11/15 1116  BP: 124/80         Subjective Assessment - 06/11/15 1116    Subjective Right arm and leg feel weak.   Patient Stated Goals Get back to work.   Currently in Pain? --   Pain Score 8    Pain Location Leg   Pain Orientation Right   Pain Descriptors / Indicators Shooting   Pain Type Acute pain   Pain Onset 1 to 4 weeks ago   Pain Frequency Intermittent   Aggravating Factors  Walking.   Pain Relieving Factors Being off my feet.            [facility] PT Assessment - 06/11/15 0001    Assessment   Medical Diagnosis Right sided weakness.   Referring Provider Kenn File MD.   Onset Date/Surgical Date --  2 weeks.   Hand Dominance Right   Balance Screen   Has the patient fallen in the past 6 months No   Has the patient had a decrease in activity level because of a fear of falling?  No   Is the patient reluctant to leave their home because of a fear of falling?  No   Home Environment   Living Environment Private residence   Additional  Comments Steps going into house with railing.   Prior Function   Level of Independence Independent   Cognition   Memory --  Patient reports some short term memory loss.   Observation/Other Assessments   Observations Mild expressive aphasia.   Coordination   Finger Nose Finger Test --  Minimal impairment.   Heel Shin Test --  Intact.   Posture/Postural Control   Posture/Postural Control Postural limitations   Postural Limitations Rounded Shoulders;Forward head   ROM / Strength   AROM / PROM / Strength Strength   Strength   Overall Strength Comments Right grip= 45# and left = 58#.  Right shoulder= 4-/5; right elbow= 4-/5.  Right hip flexion and abduction= 3+/5; right knee strength= 4-/5 and right ankle strength= 4-/5.   Ambulation/Gait   Gait Comments Antalgic gait pattern due to right trunk and LE shooting pain during stance phase (on right).   Standardized Balance Assessment   Standardized Balance Assessment Berg Balance Test   Berg Balance Test   Sit to Stand Able to stand without using hands and stabilize independently   Standing Unsupported  Able to stand safely 2 minutes   Sitting with Back Unsupported but Feet Supported on Floor or Stool Able to sit safely and securely 2 minutes   Stand to Sit Sits safely with minimal use of hands   Transfers Able to transfer safely, definite need of hands   Standing Unsupported with Eyes Closed Able to stand 10 seconds with supervision   Standing Ubsupported with Feet Together Able to place feet together independently and stand for 1 minute with supervision   From Standing, Reach Forward with Outstretched Arm Can reach forward >12 cm safely (5")   From Standing Position, Pick up Object from Floor Able to pick up shoe, needs supervision   From Standing Position, Turn to Look Behind Over each Shoulder Looks behind from both sides and weight shifts well   Turn 360 Degrees Able to turn 360 degrees safely in 4 seconds or less   Standing  Unsupported, Alternately Place Feet on Step/Stool Able to complete 4 steps without aid or supervision   Standing Unsupported, One Foot in Front Able to plae foot ahead of the other independently and hold 30 seconds   Standing on One Leg Unable to try or needs assist to prevent fall   Total Score 44                   [facility] Adult PT Treatment/Exercise - 06/11/15 0001    Exercises   Exercises Knee/Hip   Knee/Hip Exercises: Aerobic   Nustep Level 3 x 8 minutes.  patient felt fatique after this exercise.                     PT Long Term Goals - 06/11/15 1218    PT LONG TERM GOAL #1   Title Ind with an advanced HEP.   Time 8   Period Weeks   Status New   PT LONG TERM GOAL #2   Title Normal right UE coordination.   Time 8   Period Weeks   Status New   PT LONG TERM GOAL #3   Title Right U and LE strength= 5/5.   Time 8   Period Weeks   Status New   PT LONG TERM GOAL #4   Title Berg score= 52/56.   Time 8   Period Weeks   Status New   PT LONG TERM GOAL #5   Title Non-antalgic gait pattern.               Plan - 06/11/15 1206    Clinical Impression Statement The patient sufferred a stroke 2 weeks ago.  She is currently out of work from a Engineer, materials.  She does report a shooting pain when walking through her right LE.  She c/o weakness and a lack of endurance.  A Berg test revealed a balance deficit and she demonstrates right UE and LE weakness.   Rehab Potential Excellent   PT Frequency 2x / week   PT Duration 8 weeks   PT Treatment/Interventions Therapeutic exercise;Therapeutic activities;Balance training;Neuromuscular re-education;Patient/family education   PT Next Visit Plan Balance and gait training.  Right U and LE strengthening.   Consulted and Agree with Plan of Care Patient      Patient will benefit from skilled therapeutic intervention in order to improve the following deficits and impairments:  Abnormal gait, Decreased activity  tolerance, Decreased strength  Visit Diagnosis: Muscle weakness (generalized) - Plan: PT plan of care cert/re-cert  Other abnormalities of gait and mobility -  Plan: PT plan of care cert/re-cert  Other lack of coordination - Plan: PT plan of care cert/re-cert     Problem List Patient Active Problem List   Diagnosis Date Noted  . Numbness and tingling of right arm and leg 05/28/2015  . Right sided weakness   . Tobacco use disorder 03/17/2015  . Esophageal reflux 03/16/2015  . Vitamin D deficiency 02/03/2015  . Generalized anxiety disorder 02/03/2015  . Numbness 02/03/2015    Izael Bessinger, [place] MPT 06/11/2015, 12:29 PM  [facility] [address] [place], [place], [address] Phone: [phone]   Fax:  [phone]  Name: SHALITHA BELLUS MRN: [id number] Date of Birth: [birth year]

## 2015-06-11 NOTE — Telephone Encounter (Signed)
Reviewed cahrt, can we get her worked in to discuss.   I do not Seee where PT said anything directly but I am glad to eval and discuss. I very much appreciate our PT's opinion.   Laroy Apple, MD [facility] 06/11/2015, 4:24 PM

## 2015-06-12 ENCOUNTER — Ambulatory Visit (INDEPENDENT_AMBULATORY_CARE_PROVIDER_SITE_OTHER): Payer: BLUE CROSS/BLUE SHIELD | Admitting: Family Medicine

## 2015-06-12 ENCOUNTER — Encounter: Payer: Self-pay | Admitting: Family Medicine

## 2015-06-12 VITALS — BP 124/81 | HR 70 | Temp 97.0°F | Ht 62.0 in | Wt 172.6 lb

## 2015-06-12 DIAGNOSIS — M6289 Other specified disorders of muscle: Secondary | ICD-10-CM

## 2015-06-12 DIAGNOSIS — R202 Paresthesia of skin: Secondary | ICD-10-CM

## 2015-06-12 DIAGNOSIS — R531 Weakness: Secondary | ICD-10-CM

## 2015-06-12 DIAGNOSIS — R200 Anesthesia of skin: Secondary | ICD-10-CM

## 2015-06-12 LAB — CBC WITH DIFFERENTIAL/PLATELET
BASOS: 1 %
Basophils Absolute: 0 10*3/uL (ref 0.0–0.2)
EOS (ABSOLUTE): 0.3 10*3/uL (ref 0.0–0.4)
Eos: 5 %
HEMATOCRIT: 40.1 % (ref 34.0–46.6)
Hemoglobin: 14.2 g/dL (ref 11.1–15.9)
Immature Grans (Abs): 0 10*3/uL (ref 0.0–0.1)
Immature Granulocytes: 0 %
LYMPHS ABS: 1.7 10*3/uL (ref 0.7–3.1)
Lymphs: 26 %
MCH: 30.7 pg (ref 26.6–33.0)
MCHC: 35.4 g/dL (ref 31.5–35.7)
MCV: 87 fL (ref 79–97)
MONOS ABS: 0.4 10*3/uL (ref 0.1–0.9)
Monocytes: 6 %
NEUTROS ABS: 4.1 10*3/uL (ref 1.4–7.0)
NEUTROS PCT: 62 %
PLATELETS: 252 10*3/uL (ref 150–379)
RBC: 4.62 x10E6/uL (ref 3.77–5.28)
RDW: 13.8 % (ref 12.3–15.4)
WBC: 6.5 10*3/uL (ref 3.4–10.8)

## 2015-06-12 LAB — CMP14+EGFR
A/G RATIO: 1.4 (ref 1.2–2.2)
ALT: 11 IU/L (ref 0–32)
AST: 14 IU/L (ref 0–40)
Albumin: 3.9 g/dL (ref 3.5–5.5)
Alkaline Phosphatase: 66 IU/L (ref 39–117)
BILIRUBIN TOTAL: 0.3 mg/dL (ref 0.0–1.2)
BUN/Creatinine Ratio: 16 (ref 9–23)
BUN: 12 mg/dL (ref 6–24)
CHLORIDE: 105 mmol/L (ref 96–106)
CO2: 22 mmol/L (ref 18–29)
Calcium: 9.2 mg/dL (ref 8.7–10.2)
Creatinine, Ser: 0.74 mg/dL (ref 0.57–1.00)
GFR calc non Af Amer: 96 mL/min/{1.73_m2} (ref >59)
GFR, EST AFRICAN AMERICAN: 111 mL/min/{1.73_m2} (ref >59)
GLOBULIN, TOTAL: 2.8 g/dL (ref 1.5–4.5)
Glucose: 106 mg/dL — ABNORMAL HIGH (ref 65–99)
POTASSIUM: 4.4 mmol/L (ref 3.5–5.2)
SODIUM: 138 mmol/L (ref 134–144)
Total Protein: 6.7 g/dL (ref 6.0–8.5)

## 2015-06-12 NOTE — Patient Instructions (Signed)
Great to see you!  Lets see you again in 2 weeks  In the meantime please continue PT  PLease let us know if anything changes quickly, follow up for any concerns.  If you have worsening weakness, numbness, or have trouble talking please go to the emergency room.

## 2015-06-12 NOTE — Progress Notes (Signed)
   HPI  Patient presents today here with right-sided weakness.  Patient's lines that she was seen at physical therapy yesterday and told that she probably should not return to work and should see her PCP to discuss it.  She's had continued right-sided weakness and paresthesias over the last 3-4 weeks after her hospitalization.  He had an MRI and CT of the hospital which did not show stroke.  She had improvement in symptoms before discharge.  She was not seen by neurology at that time.  She states that she had another episode this weekend of worsening weakness and numbness which improved after to aspirin. She's also noticed mild short-term memory loss over the last 1 week.  PMH: Smoking status noted ROS: Per HPI  Objective: BP 124/81 mmHg  Pulse 70  Temp(Src) 97 F (36.1 C) (Oral)  Ht 5\' 2"  (1.575 m)  Wt 172 lb 9.6 oz (78.291 kg)  BMI 31.56 kg/m2  LMP 06/07/2015 Gen: NAD, alert, cooperative with exam HEENT: NCAT CV: RRR, good S1/S2, no murmur Resp: CTABL, no wheezes, non-labored Ext: No edema, warm Neuro: Alert and oriented, strength 3/5 on the right lower extremity, difficulty lifting leg against any force, left lower extremity 4/5. Reports paresthesias with normal pressure touch upper and lower right extremity   Assessment and plan:  # Right-sided weakness Unusual presentation and no clear etiology Memory loss is mild and is difficult to fit into the overall clinical picture Continue current treatments including physical therapy, monitor closely I have written a letter to excuse her from work for 2 weeks, given and reviewed with her very strict precautions for monitoring stroke. She has a neurology appointment at the end of this month Return to clinic in 2 weeks for follow-up, if symptoms are persistent she may not be able to return to work at that time   Laroy Apple, MD [place] 06/12/2015, 8:53 AM

## 2015-06-14 ENCOUNTER — Encounter: Payer: BLUE CROSS/BLUE SHIELD | Admitting: Physical Therapy

## 2015-06-18 ENCOUNTER — Ambulatory Visit: Payer: BLUE CROSS/BLUE SHIELD | Admitting: Family Medicine

## 2015-06-19 ENCOUNTER — Ambulatory Visit: Payer: BLUE CROSS/BLUE SHIELD | Admitting: Physical Therapy

## 2015-06-19 ENCOUNTER — Encounter: Payer: Self-pay | Admitting: Physical Therapy

## 2015-06-19 VITALS — BP 111/75 | HR 77

## 2015-06-19 DIAGNOSIS — M6281 Muscle weakness (generalized): Secondary | ICD-10-CM

## 2015-06-19 DIAGNOSIS — R2689 Other abnormalities of gait and mobility: Secondary | ICD-10-CM

## 2015-06-19 DIAGNOSIS — R278 Other lack of coordination: Secondary | ICD-10-CM

## 2015-06-19 NOTE — Therapy (Signed)
[facility] [place], [place], [address] Phone: [phone]   Fax:  [phone]  Physical Therapy Treatment  Patient Details  Name: Heather [name] MRN: [id number] Date of Birth: 09/23/1965 Referring Provider: Kenn File MD.  Encounter Date: 06/19/2015      PT End of Session - 06/19/15 1256    Visit Number 2   Number of Visits 16   Date for PT Re-Evaluation 08/06/15   PT Start Time [id number]   PT Stop Time 1345   PT Time Calculation (min) 40 min   Activity Tolerance Patient limited by fatigue;Patient tolerated treatment well;Patient limited by pain   Behavior During Therapy [facility] for tasks assessed/performed      Past Medical History  Diagnosis Date  . Anxiety   . Emphysema of lung ([place])   . GERD (gastroesophageal reflux disease)   . Stroke ([place])   . Cancer [facility])     colon    Past Surgical History  Procedure Laterality Date  . External ear surgery    . Skin graft    . Colon surgery      Filed Vitals:   06/19/15 1256  BP: 111/75  Pulse: 77  SpO2: 98%        Subjective Assessment - 06/19/15 1256    Subjective Reports that her pain is very bad today and that she is still numb along her whole R side.   Patient Stated Goals Get back to work.   Currently in Pain? Yes   Pain Score 9    Pain Orientation Right   Pain Descriptors / Indicators Numbness   Pain Type Acute pain   Pain Radiating Towards R side of body   Pain Onset 1 to 4 weeks ago            [facility] PT Assessment - 06/19/15 0001    Assessment   Medical Diagnosis Right sided weakness.   Hand Dominance Right                     [facility] Adult PT Treatment/Exercise - 06/19/15 0001    Exercises   Exercises Shoulder;Hand;Knee/Hip   Knee/Hip Exercises: Aerobic   Nustep L3, seat 7 x10 min   Knee/Hip Exercises: Seated   Long Arc Quad Strengthening;Right;2 sets;10 reps;Weights   Long Arc Quad Weight 3 lbs.   Knee/Hip Exercises:  Supine   Bridges Limitations 2x10 reps   Other Supine Knee/Hip Exercises Supine B hip clamshell red theraband x20 reps   Shoulder Exercises: Standing   Other Standing Exercises RUE color reaching into neutral, flexion, adduction x3 min   Hand Exercises   Other Hand Exercises R hand red web grip x30 reps             Balance Exercises - 06/19/15 1329    Balance Exercises: Standing   Standing Eyes Opened Solid surface;Other (comment)  reaching across midline at shoulder and head height x2 min   Standing Eyes Closed Narrow base of support (BOS);Foam/compliant surface  1 UE support x2 min   Step Ups Forward;4 inch;UE support 2  x20 reps   Heel Raises Limitations x20 reps   Toe Raise Limitations x20 reps   Other Standing Exercises B toe taps 6" x15 reps; eyes open DLS airex 1 UE support x2 min                PT Long Term Goals - 06/11/15 1218    PT LONG TERM GOAL #1  Title Ind with an advanced HEP.   Time 8   Period Weeks   Status New   PT LONG TERM GOAL #2   Title Normal right UE coordination.   Time 8   Period Weeks   Status New   PT LONG TERM GOAL #3   Title Right U and LE strength= 5/5.   Time 8   Period Weeks   Status New   PT LONG TERM GOAL #4   Title Berg score= 52/56.   Time 8   Period Weeks   Status New   PT LONG TERM GOAL #5   Title Non-antalgic gait pattern.               Plan - 06/19/15 1345    Clinical Impression Statement Patient tolerated today's treatment fairly well as she reported increased R side pain and numbness with increased weightbearing through RLE. Patient's movements are very slow and purposeful at this time with both UE and LE. Patient continues to ambulate with an antalgic gait and reports use of cane at home as she reports RLE buckles frequently. Patient demonstrates a lack of full weightbearing through RLE with bridges and standing balance exercises, and deficit with R ankle during heel/toe raises.    Rehab Potential  Excellent   PT Frequency 2x / week   PT Duration 8 weeks   PT Treatment/Interventions Therapeutic exercise;Therapeutic activities;Balance training;Neuromuscular re-education;Patient/family education   PT Next Visit Plan Balance and gait training.  Right U and LE strengthening.   Consulted and Agree with Plan of Care Patient      Patient will benefit from skilled therapeutic intervention in order to improve the following deficits and impairments:  Abnormal gait, Decreased activity tolerance, Decreased strength  Visit Diagnosis: Muscle weakness (generalized)  Other abnormalities of gait and mobility  Other lack of coordination     Problem List Patient Active Problem List   Diagnosis Date Noted  . Numbness and tingling of right arm and leg 05/28/2015  . Right sided weakness   . Tobacco use disorder 03/17/2015  . Esophageal reflux 03/16/2015  . Vitamin D deficiency 02/03/2015  . Generalized anxiety disorder 02/03/2015  . Numbness 02/03/2015    Wynelle Fanny, PTA 06/19/2015, 1:53 PM  [facility] [address] [place], [place], [address] Phone: [phone]   Fax:  [phone]  Name: Heather [name] MRN: [id number] Date of Birth: 1965/10/28

## 2015-06-21 ENCOUNTER — Ambulatory Visit: Payer: BLUE CROSS/BLUE SHIELD | Admitting: Physical Therapy

## 2015-06-21 ENCOUNTER — Encounter: Payer: Self-pay | Admitting: Physical Therapy

## 2015-06-21 VITALS — BP 111/74 | HR 79

## 2015-06-21 DIAGNOSIS — R2689 Other abnormalities of gait and mobility: Secondary | ICD-10-CM

## 2015-06-21 DIAGNOSIS — R278 Other lack of coordination: Secondary | ICD-10-CM

## 2015-06-21 DIAGNOSIS — M6281 Muscle weakness (generalized): Secondary | ICD-10-CM | POA: Diagnosis not present

## 2015-06-21 NOTE — Therapy (Addendum)
[facility] [place], [place], [address] Phone: [phone]   Fax:  [phone]  Physical Therapy Treatment  Patient Details  Name: Heather [name] MRN: [id number] Date of Birth: Sep 03, 1965 Referring Provider: Kenn File MD.  Encounter Date: 06/21/2015    Past Medical History:  Diagnosis Date  . Anxiety   . Chronic headache   . Depression   . Emphysema of lung ([place])   . Gastritis   . GERD (gastroesophageal reflux disease)   . Polyp of colon 04/2015   benign  . Stroke [facility])    STATED 2 LIGHT STROKES JUNE 21ST 2017  . Tobacco abuse     Past Surgical History:  Procedure Laterality Date  . COLONOSCOPY    . EXTERNAL EAR SURGERY    . POLYPECTOMY    . SKIN GRAFT      Vitals:   06/21/15 1447  BP: 111/74  Pulse: 79                                      PT Long Term Goals - 06/11/15 1218      PT LONG TERM GOAL #1   Title Ind with an advanced HEP.   Time 8   Period Weeks   Status New     PT LONG TERM GOAL #2   Title Normal right UE coordination.   Time 8   Period Weeks   Status New     PT LONG TERM GOAL #3   Title Right U and LE strength= 5/5.   Time 8   Period Weeks   Status New     PT LONG TERM GOAL #4   Title Berg score= 52/56.   Time 8   Period Weeks   Status New     PT LONG TERM GOAL #5   Title Non-antalgic gait pattern.             Patient will benefit from skilled therapeutic intervention in order to improve the following deficits and impairments:  Abnormal gait, Decreased activity tolerance, Decreased strength  Visit Diagnosis: Muscle weakness (generalized)  Other abnormalities of gait and mobility  Other lack of coordination     Problem List Patient Active Problem List   Diagnosis Date Noted  . Abnormal vaginal bleeding 09/20/2015  . Cyst of right ovary 09/20/2015  . Right lower quadrant abdominal pain 09/20/2015  . Abdominal  pain, left lower quadrant 09/20/2015  . Abnormal CT scan, colon 09/20/2015  . Numbness and tingling of right arm and leg 05/28/2015  . Right sided weakness   . Tobacco use disorder 03/17/2015  . Esophageal reflux 03/16/2015  . Vitamin D deficiency 02/03/2015  . Generalized anxiety disorder 02/03/2015  . Numbness 02/03/2015    APPLEGATE, [place], PTA 01/23/2016, 4:58 PM  [facility] [place], [place], [address] Phone: [phone]   Fax:  [phone]  Name: Heather [name] MRN: [id number] Date of Birth: [birth year]  PHYSICAL THERAPY DISCHARGE SUMMARY  Visits from Start of Care: 3.  Current functional level related to goals / functional outcomes: See above.   Remaining deficits: See below.   Education / Equipment: HEP. Plan: Patient agrees to discharge.  Patient goals were not met. Patient is being discharged due to not returning since the last visit.  ?????        Mali Applegate MPT

## 2015-06-21 NOTE — Patient Instructions (Signed)
Pelvic Tilt: Posterior - Legs Bent (Supine)  Tighten stomach and flatten back by rolling pelvis down. Hold _10___ seconds. Relax. Repeat _10-30___ times per set. Do __2__ sets per session. Do _2___ sessions per day.   Bent Leg Lift (Hook-Lying) Straight Leg Raise  Tighten stomach and slowly raise locked right leg __4__ inches from floor. Repeat __10-30__ times per set. Do __2__ sets per session. Do __2__ sessions per day.  Bridging  Slowly raise buttocks from floor, keeping stomach tight. Repeat _10___ times per set. Do __2__ sets per session. Do __2__ sessions per day.

## 2015-06-22 ENCOUNTER — Other Ambulatory Visit: Payer: Self-pay | Admitting: Pediatrics

## 2015-06-26 ENCOUNTER — Ambulatory Visit (INDEPENDENT_AMBULATORY_CARE_PROVIDER_SITE_OTHER): Payer: BLUE CROSS/BLUE SHIELD | Admitting: Family Medicine

## 2015-06-26 ENCOUNTER — Encounter: Payer: Self-pay | Admitting: Family Medicine

## 2015-06-26 VITALS — BP 115/75 | HR 83 | Temp 98.7°F | Ht 62.0 in | Wt 171.4 lb

## 2015-06-26 DIAGNOSIS — F411 Generalized anxiety disorder: Secondary | ICD-10-CM | POA: Diagnosis not present

## 2015-06-26 DIAGNOSIS — R531 Weakness: Secondary | ICD-10-CM

## 2015-06-26 DIAGNOSIS — M6289 Other specified disorders of muscle: Secondary | ICD-10-CM

## 2015-06-26 DIAGNOSIS — R440 Auditory hallucinations: Secondary | ICD-10-CM

## 2015-06-26 NOTE — Patient Instructions (Signed)
Great to see you!  Go back to taking escitalopram once daily, I think it was helping you mood more than you realized  We are calling to see if we can get you worked in sooner to see neurology

## 2015-06-26 NOTE — Progress Notes (Signed)
   HPI  Patient presents today here for follow-up right sided weakness.  Patient continues to have symptoms and states that her weakness and numbness are continuing, the numbness seems to be worse.. Over the last 1-1-1/2 weeks she reports voices as she is laying down at night. She states that they're not saying anything in particular, she has no visual hallucinations.  He describes numbness and weakness of the right upper extremity, right lower extremity, and right face.  She does not feel that she can work currently given her symptoms.  About 2 weeks ago she started taking Lexapro only as needed, she's only taken a few doses since that time.  She denies suicidal ideation.    PMH: Smoking status noted ROS: Per HPI  Objective: BP 115/75 mmHg  Pulse 83  Temp(Src) 98.7 F (37.1 C) (Oral)  Ht 5\' 2"  (1.575 m)  Wt 171 lb 6 oz (77.735 kg)  BMI 31.34 kg/m2  LMP 06/07/2015 Gen: NAD, alert, cooperative with exam HEENT: NCAT, EOMI, PERRL CV: RRR, good S1/S2, no murmur Resp: CTABL, no wheezes, non-labored Ext: No edema, warm Neuro: Alert and oriented, strength 5/5 and sensation intact in left upper and lower extremity, right upper and lower extremity with 3/5 strength and reported complete numbness to sensation, also right-sided facial numbness with fifth nerve testing. Symmetric smile, midline tongue protrusion, strength/5 bilaterally with SCM testing. Shoulder shrug is 5/5 strength symmetric but her effort is low   Depression screen [facility] 2/9 06/26/2015 06/12/2015 05/28/2015 05/10/2015 04/05/2015  Decreased Interest 3 0 0 0 0  Down, Depressed, Hopeless 3 0 0 0 0  PHQ - 2 Score 6 0 0 0 0  Altered sleeping 3 - - - -  Tired, decreased energy 3 - - - -  Change in appetite 3 - - - -  Feeling bad or failure about yourself  0 - - - -  Trouble concentrating 3 - - - -  Moving slowly or fidgety/restless 3 - - - -  Suicidal thoughts 0 - - - -  PHQ-9 Score 21 - - - -  Difficult doing work/chores  Somewhat difficult - - - -    GAD 7 : Generalized Anxiety Score 06/26/2015  Nervous, Anxious, on Edge 3  Control/stop worrying 3  Worry too much - different things 3  Trouble relaxing 3  Restless 3  Easily annoyed or irritable 0  Afraid - awful might happen 0  Total GAD 7 Score 15  Anxiety Difficulty Somewhat difficult       Assessment and plan:  # Right-sided weakness, numbness Persistent, patient reports worsening Recent hospitalization and MRI/MRA with no abnormalities Physical therapy is going okay Agree with keeping her out for another 2 weeks   # Auditory hallucinations Unclear etiology unusual constellation of symptoms, has right-sided weakness and numbness, as well as her short-term memory loss complaints  Consider emergent auditory hallucinations associated with depression, as she has recently decreased her Lexapro dose Restart Lexapro Monitor for more symptoms Appreciate neurology evaluation  # GAD Discussed daily dosing of Lexapro Her symptoms have worsened since she changed the dose She also has some signs of depression, her PHQ-9 score is 21 today   Laroy Apple, MD [facility] 06/26/2015, 3:39 PM

## 2015-06-27 ENCOUNTER — Encounter: Payer: BLUE CROSS/BLUE SHIELD | Admitting: Physical Therapy

## 2015-07-03 ENCOUNTER — Telehealth: Payer: Self-pay | Admitting: Family Medicine

## 2015-07-03 MED ORDER — RIZATRIPTAN BENZOATE 10 MG PO TABS: ORAL_TABLET | ORAL | Status: DC

## 2015-07-03 NOTE — Telephone Encounter (Signed)
Called and discussed.   Pt having severe headaches that are responding well to maxalt.   She requests refill.   I explained that this is not an everday medicine, she understands.   She denies any additional weakness numbness or changes since our last visit.   Has neurology appt in 3 days, appreciate their recommendations.   Laroy Apple, MD [facility] 07/03/2015, 5:15 PM

## 2015-07-06 ENCOUNTER — Other Ambulatory Visit (INDEPENDENT_AMBULATORY_CARE_PROVIDER_SITE_OTHER): Payer: BLUE CROSS/BLUE SHIELD

## 2015-07-06 ENCOUNTER — Encounter: Payer: Self-pay | Admitting: Neurology

## 2015-07-06 ENCOUNTER — Ambulatory Visit: Payer: BLUE CROSS/BLUE SHIELD

## 2015-07-06 ENCOUNTER — Ambulatory Visit (INDEPENDENT_AMBULATORY_CARE_PROVIDER_SITE_OTHER): Payer: BLUE CROSS/BLUE SHIELD | Admitting: Neurology

## 2015-07-06 VITALS — BP 120/78 | HR 73 | Ht 62.0 in | Wt 173.2 lb

## 2015-07-06 DIAGNOSIS — R42 Dizziness and giddiness: Secondary | ICD-10-CM

## 2015-07-06 DIAGNOSIS — R202 Paresthesia of skin: Secondary | ICD-10-CM

## 2015-07-06 DIAGNOSIS — F17200 Nicotine dependence, unspecified, uncomplicated: Secondary | ICD-10-CM

## 2015-07-06 LAB — VITAMIN B12: VITAMIN B 12: 207 pg/mL — AB (ref 211–911)

## 2015-07-06 LAB — FOLATE: FOLATE: 8.9 ng/mL (ref >5.9)
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
## 2015-07-09 ENCOUNTER — Ambulatory Visit (HOSPITAL_COMMUNITY): Admission: RE | Admit: 2015-07-09 | Payer: BLUE CROSS/BLUE SHIELD | Source: Ambulatory Visit

## 2015-07-09 ENCOUNTER — Ambulatory Visit (INDEPENDENT_AMBULATORY_CARE_PROVIDER_SITE_OTHER): Payer: BLUE CROSS/BLUE SHIELD | Admitting: *Deleted

## 2015-07-09 DIAGNOSIS — E538 Deficiency of other specified B group vitamins: Secondary | ICD-10-CM

## 2015-07-09 MED ORDER — CYANOCOBALAMIN 1000 MCG/ML IJ SOLN
1000.0000 ug | Freq: Every day | INTRAMUSCULAR | Status: AC
  Administered 2015-07-09 – 2015-07-12 (×3): 1000 ug via INTRAMUSCULAR

## 2015-07-09 NOTE — Patient Instructions (Signed)

## 2015-07-09 NOTE — Progress Notes (Signed)
Vitamin b12 injection given and patient tolerated well.  Vitamin b12 injection started by Dr. Posey Pronto.  Patient is to have 1 injection daily for 7 days, weekly for 4 weeks, and then monthly for 1 year.

## 2015-07-10 LAB — METHYLMALONIC ACID, SERUM: Methylmalonic Acid, Quant: 132 nmol/L (ref 87–318)

## 2015-07-11 ENCOUNTER — Ambulatory Visit (INDEPENDENT_AMBULATORY_CARE_PROVIDER_SITE_OTHER): Payer: BLUE CROSS/BLUE SHIELD

## 2015-07-11 DIAGNOSIS — E538 Deficiency of other specified B group vitamins: Secondary | ICD-10-CM | POA: Diagnosis not present

## 2015-07-11 NOTE — Patient Instructions (Signed)

## 2015-07-11 NOTE — Progress Notes (Signed)
B-12 injection given to left deltoid, patient tolerated well 

## 2015-07-12 ENCOUNTER — Ambulatory Visit (INDEPENDENT_AMBULATORY_CARE_PROVIDER_SITE_OTHER): Payer: BLUE CROSS/BLUE SHIELD | Admitting: *Deleted

## 2015-07-12 DIAGNOSIS — E538 Deficiency of other specified B group vitamins: Secondary | ICD-10-CM | POA: Diagnosis not present

## 2015-07-12 NOTE — Progress Notes (Signed)
Pt given Vit B12 inj 1000mcg Tolerated well 

## 2015-07-13 ENCOUNTER — Ambulatory Visit: Payer: BLUE CROSS/BLUE SHIELD

## 2015-07-16 ENCOUNTER — Ambulatory Visit: Payer: BLUE CROSS/BLUE SHIELD

## 2015-07-17 ENCOUNTER — Ambulatory Visit: Payer: BLUE CROSS/BLUE SHIELD

## 2015-07-17 ENCOUNTER — Encounter: Payer: BLUE CROSS/BLUE SHIELD | Admitting: Neurology

## 2015-07-17 DIAGNOSIS — Z029 Encounter for administrative examinations, unspecified: Secondary | ICD-10-CM

## 2015-07-18 ENCOUNTER — Ambulatory Visit: Payer: BLUE CROSS/BLUE SHIELD

## 2015-07-19 ENCOUNTER — Ambulatory Visit: Payer: Self-pay

## 2015-07-19 DIAGNOSIS — R202 Paresthesia of skin: Secondary | ICD-10-CM

## 2015-07-19 DIAGNOSIS — R42 Dizziness and giddiness: Secondary | ICD-10-CM

## 2015-07-19 LAB — VAS US CAROTID
LCCAPSYS: 115 cm/s
LEFT ECA DIAS: -27 cm/s
LEFT VERTEBRAL DIAS: -22 cm/s
LICADDIAS: -41 cm/s
LICAPSYS: -53 cm/s
Left CCA dist dias: -32 cm/s
Left CCA dist sys: -81 cm/s
Left CCA prox dias: 31 cm/s
Left ICA dist sys: -99 cm/s
Left ICA prox dias: -17 cm/s
RCCADSYS: -125 cm/s
RIGHT ECA DIAS: -21 cm/s
RIGHT VERTEBRAL DIAS: -19 cm/s
Right CCA prox dias: 36 cm/s
Right CCA prox sys: 119 cm/s

## 2015-07-20 ENCOUNTER — Encounter: Payer: Self-pay | Admitting: *Deleted

## 2015-07-24 ENCOUNTER — Encounter (HOSPITAL_COMMUNITY): Payer: Self-pay | Admitting: Emergency Medicine

## 2015-07-24 ENCOUNTER — Emergency Department (HOSPITAL_COMMUNITY)
Admission: EM | Admit: 2015-07-24 | Discharge: 2015-07-24 | Disposition: A | Discharge status: Home / Self Care | Payer: Self-pay | Attending: Emergency Medicine | Admitting: Emergency Medicine

## 2015-07-24 DIAGNOSIS — R51 Headache: Secondary | ICD-10-CM | POA: Insufficient documentation

## 2015-07-24 DIAGNOSIS — Z85038 Personal history of other malignant neoplasm of large intestine: Secondary | ICD-10-CM | POA: Insufficient documentation

## 2015-07-24 DIAGNOSIS — R519 Headache, unspecified: Secondary | ICD-10-CM

## 2015-07-24 DIAGNOSIS — Z79899 Other long term (current) drug therapy: Secondary | ICD-10-CM | POA: Insufficient documentation

## 2015-07-24 DIAGNOSIS — F17210 Nicotine dependence, cigarettes, uncomplicated: Secondary | ICD-10-CM | POA: Insufficient documentation

## 2015-07-24 LAB — CBC WITH DIFFERENTIAL/PLATELET
BASOS PCT: 0 %
Basophils Absolute: 0 10*3/uL (ref 0.0–0.1)
EOS ABS: 0.2 10*3/uL (ref 0.0–0.7)
Eosinophils Relative: 3 %
HCT: 39 % (ref 36.0–46.0)
Hemoglobin: 13.9 g/dL (ref 12.0–15.0)
LYMPHS PCT: 23 %
Lymphs Abs: 1.7 10*3/uL (ref 0.7–4.0)
MCH: 31.1 pg (ref 26.0–34.0)
MCHC: 35.6 g/dL (ref 30.0–36.0)
MCV: 87.2 fL (ref 78.0–100.0)
MONO ABS: 0.5 10*3/uL (ref 0.1–1.0)
MONOS PCT: 7 %
NEUTROS ABS: 4.7 10*3/uL (ref 1.7–7.7)
Neutrophils Relative %: 67 %
Platelets: 256 10*3/uL (ref 150–400)
RBC: 4.47 MIL/uL (ref 3.87–5.11)
RDW: 13.5 % (ref 11.5–15.5)
WBC: 7.2 10*3/uL (ref 4.0–10.5)

## 2015-07-24 LAB — COMPREHENSIVE METABOLIC PANEL
ALBUMIN: 3.8 g/dL (ref 3.5–5.0)
ALT: 13 U/L — ABNORMAL LOW (ref 14–54)
ANION GAP: 9 (ref 5–15)
AST: 14 U/L — ABNORMAL LOW (ref 15–41)
Alkaline Phosphatase: 68 U/L (ref 38–126)
BILIRUBIN TOTAL: 0.9 mg/dL (ref 0.3–1.2)
BUN: 11 mg/dL (ref 6–20)
CO2: 23 mmol/L (ref 22–32)
Calcium: 8.7 mg/dL — ABNORMAL LOW (ref 8.9–10.3)
Chloride: 105 mmol/L (ref 101–111)
Creatinine, Ser: 0.68 mg/dL (ref 0.44–1.00)
GFR calc non Af Amer: >60 mL/min (ref >60)
GLUCOSE: 89 mg/dL (ref 65–99)
POTASSIUM: 3.8 mmol/L (ref 3.5–5.1)
SODIUM: 137 mmol/L (ref 135–145)
TOTAL PROTEIN: 7 g/dL (ref 6.5–8.1)

## 2015-07-24 MED ORDER — PREDNISONE 10 MG PO TABS: 20.0000 mg | ORAL_TABLET | Freq: Every day | ORAL | Status: DC

## 2015-07-24 MED ORDER — ONDANSETRON HCL 4 MG/2ML IJ SOLN
4.0000 mg | Freq: Once | INTRAMUSCULAR | Status: AC
  Administered 2015-07-24: 4 mg via INTRAVENOUS
  Filled 2015-07-24: qty 2

## 2015-07-24 MED ORDER — AZITHROMYCIN 250 MG PO TABS: ORAL_TABLET | ORAL | Status: DC

## 2015-07-24 MED ORDER — OXYCODONE-ACETAMINOPHEN 5-325 MG PO TABS: 1.0000 | ORAL_TABLET | Freq: Four times a day (QID) | ORAL | Status: DC | PRN

## 2015-07-24 MED ORDER — HYDROMORPHONE HCL 1 MG/ML IJ SOLN
1.0000 mg | Freq: Once | INTRAMUSCULAR | Status: AC
  Administered 2015-07-24: 1 mg via INTRAVENOUS
  Filled 2015-07-24: qty 1

## 2015-07-24 NOTE — Discharge Instructions (Signed)
Follow up with your md in 1-2 weeks. °

## 2015-07-24 NOTE — ED Provider Notes (Signed)
CSN: [id number]     Arrival date & time 07/24/15  1357 History   First MD Initiated Contact with Patient 07/24/15 1459     Chief Complaint  Patient presents with  . Migraine     (Consider location/radiation/quality/duration/timing/severity/associated sxs/prior Treatment) Patient is a 50 y.o. female presenting with migraines. The history is provided by the patient (Patient complains of a headache and some swelling in her face. She's had a lot of problems with headaches).  Migraine This is a recurrent problem. The current episode started 12 to 24 hours ago. The problem occurs constantly. The problem has not changed since onset.Pertinent negatives include no chest pain, no abdominal pain and no headaches. Nothing aggravates the symptoms. Nothing relieves the symptoms.    Past Medical History  Diagnosis Date  . Anxiety   . Emphysema of lung ([place])   . GERD (gastroesophageal reflux disease)   . Cancer ([place])     colon  . Tobacco abuse    Past Surgical History  Procedure Laterality Date  . External ear surgery    . Skin graft    . Colon surgery     Family History  Problem Relation Age of Onset  . Heart failure Mother   . Colon cancer Mother 107  . Bladder Cancer Mother   . Heart disease Father   . Breast cancer Sister   . Heart disease Maternal Aunt   . Heart disease Maternal Uncle   . Colon cancer Paternal Aunt 44  . Bipolar disorder Son    Social History  Substance Use Topics  . Smoking status: Current Every Day Smoker -- 1.00 packs/day for 30 years    Types: Cigarettes  . Smokeless tobacco: Never Used     Comment: 02-08-2015 info given  . Alcohol Use: No   OB History    Gravida Para Term Preterm AB TAB SAB Ectopic Multiple Living            2     Review of Systems  Constitutional: Negative for appetite change and fatigue.  HENT: Negative for congestion, ear discharge and sinus pressure.   Eyes: Negative for discharge.  Respiratory: Negative for cough.    Cardiovascular: Negative for chest pain.  Gastrointestinal: Negative for abdominal pain and diarrhea.  Genitourinary: Negative for frequency and hematuria.  Musculoskeletal: Negative for back pain.  Skin: Negative for rash.  Neurological: Negative for seizures and headaches.  Psychiatric/Behavioral: Negative for hallucinations.      Allergies  Aleve; Asa; Excedrin; and Vicodin  Home Medications   Prior to Admission medications   Medication Sig Start Date End Date Taking? Authorizing Provider  escitalopram (LEXAPRO) 5 MG tablet TAKE 1 TABLET (5 MG TOTAL) BY MOUTH DAILY. 06/22/15  Yes Timmothy Euler, MD  gabapentin (NEURONTIN) 300 MG capsule TAKE 1 CAPSULE (300 MG TOTAL) BY MOUTH 2 (TWO) TIMES DAILY. 03/22/15  Yes Historical Provider, MD  pantoprazole (PROTONIX) 40 MG tablet Take 1 tablet (40 mg total) by mouth daily. 02/14/15  Yes Jerene Bears, MD  rizatriptan (MAXALT) 10 MG tablet TAKE 1 TABLET (10 MG TOTAL) BY MOUTH AS NEEDED FOR MIGRAINE. MAY REPEAT IN 2 HOURS IF NEEDED 07/03/15  Yes Timmothy Euler, MD  azithromycin (ZITHROMAX Z-PAK) 250 MG tablet 2 po day one, then 1 daily x 4 days 07/24/15   Milton Ferguson, MD  ondansetron (ZOFRAN) 4 MG tablet Take 1 tablet (4 mg total) by mouth every 8 (eight) hours as needed for nausea or vomiting. Patient not  taking: Reported on 07/24/2015 04/05/15   Eustaquio Maize, MD  oxyCODONE-acetaminophen (PERCOCET/ROXICET) 5-325 MG tablet Take 1 tablet by mouth every 6 (six) hours as needed. 07/24/15   Milton Ferguson, MD  predniSONE (DELTASONE) 10 MG tablet Take 2 tablets (20 mg total) by mouth daily. 07/24/15   Milton Ferguson, MD   BP 131/82 mmHg  Pulse 78  Temp(Src) 98.2 F (36.8 C) (Oral)  Resp 18  Ht 5\' 5"  (1.651 m)  Wt 173 lb 3 oz (78.557 kg)  BMI 28.82 kg/m2  SpO2 99%  LMP 07/07/2015 Physical Exam  Constitutional: She is oriented to person, place, and time. She appears well-developed.  HENT:  Head: Normocephalic.  Tender maxillary sinuses   Eyes: Conjunctivae and EOM are normal. No scleral icterus.  Neck: Neck supple. No thyromegaly present.  Cardiovascular: Normal rate and regular rhythm.  Exam reveals no gallop and no friction rub.   No murmur heard. Pulmonary/Chest: No stridor. She has no wheezes. She has no rales. She exhibits no tenderness.  Abdominal: She exhibits no distension. There is no tenderness. There is no rebound.  Musculoskeletal: Normal range of motion. She exhibits no edema.  Lymphadenopathy:    She has no cervical adenopathy.  Neurological: She is oriented to person, place, and time. She exhibits normal muscle tone. Coordination normal.  Skin: No rash noted. No erythema.  Psychiatric: She has a normal mood and affect. Her behavior is normal.    ED Course  Procedures (including critical care time) Labs Review Labs Reviewed  COMPREHENSIVE METABOLIC PANEL - Abnormal; Notable for the following:    Calcium 8.7 (*)    AST 14 (*)    ALT 13 (*)    All other components within normal limits  CBC WITH DIFFERENTIAL/PLATELET    Imaging Review No results found. I have personally reviewed and evaluated these images and lab results as part of my medical decision-making.   EKG Interpretation None      MDM   Final diagnoses:  Headache disorder    Patient with chronic headaches. Labs unremarkable. Will treat patient for possible sinus infection with Z-Pak and prednisone and she will follow-up with her PCP    Milton Ferguson, MD 07/24/15 1655

## 2015-07-24 NOTE — ED Notes (Signed)
PT c/o migraine unrelieved by home Maxalt medication x1 week. PT c/o symptoms of light/noise sensitivity. PT states she seen her PCP this past Thursday and had imaging done in [place], [place] following that visit.

## 2015-07-26 ENCOUNTER — Encounter: Payer: Self-pay | Admitting: Family Medicine

## 2015-07-31 ENCOUNTER — Other Ambulatory Visit: Payer: Self-pay | Admitting: Family Medicine

## 2015-09-15 ENCOUNTER — Encounter (HOSPITAL_COMMUNITY): Payer: Self-pay | Admitting: Emergency Medicine

## 2015-09-15 ENCOUNTER — Emergency Department (HOSPITAL_COMMUNITY)
Admission: EM | Admit: 2015-09-15 | Discharge: 2015-09-15 | Disposition: A | Discharge status: Home / Self Care | Payer: Self-pay | Attending: Emergency Medicine | Admitting: Emergency Medicine

## 2015-09-15 ENCOUNTER — Emergency Department (HOSPITAL_COMMUNITY): Payer: Self-pay

## 2015-09-15 DIAGNOSIS — R197 Diarrhea, unspecified: Secondary | ICD-10-CM | POA: Insufficient documentation

## 2015-09-15 DIAGNOSIS — R1084 Generalized abdominal pain: Secondary | ICD-10-CM | POA: Insufficient documentation

## 2015-09-15 DIAGNOSIS — F17210 Nicotine dependence, cigarettes, uncomplicated: Secondary | ICD-10-CM | POA: Insufficient documentation

## 2015-09-15 DIAGNOSIS — Z79899 Other long term (current) drug therapy: Secondary | ICD-10-CM | POA: Insufficient documentation

## 2015-09-15 DIAGNOSIS — R110 Nausea: Secondary | ICD-10-CM | POA: Insufficient documentation

## 2015-09-15 LAB — CBC
HCT: 42.7 % (ref 36.0–46.0)
Hemoglobin: 15.1 g/dL — ABNORMAL HIGH (ref 12.0–15.0)
MCH: 31.4 pg (ref 26.0–34.0)
MCHC: 35.4 g/dL (ref 30.0–36.0)
MCV: 88.8 fL (ref 78.0–100.0)
Platelets: 252 K/uL (ref 150–400)
RBC: 4.81 MIL/uL (ref 3.87–5.11)
RDW: 13.2 % (ref 11.5–15.5)
WBC: 8.8 K/uL (ref 4.0–10.5)

## 2015-09-15 LAB — COMPREHENSIVE METABOLIC PANEL
ALK PHOS: 64 U/L (ref 38–126)
ALT: 15 U/L (ref 14–54)
AST: 18 U/L (ref 15–41)
Albumin: 4.4 g/dL (ref 3.5–5.0)
Anion gap: 9 (ref 5–15)
BUN: 12 mg/dL (ref 6–20)
CALCIUM: 9.2 mg/dL (ref 8.9–10.3)
CO2: 23 mmol/L (ref 22–32)
CREATININE: 0.76 mg/dL (ref 0.44–1.00)
Chloride: 105 mmol/L (ref 101–111)
Glucose, Bld: 93 mg/dL (ref 65–99)
Potassium: 3.7 mmol/L (ref 3.5–5.1)
SODIUM: 137 mmol/L (ref 135–145)
Total Bilirubin: 0.7 mg/dL (ref 0.3–1.2)
Total Protein: 7.5 g/dL (ref 6.5–8.1)

## 2015-09-15 LAB — URINALYSIS, ROUTINE W REFLEX MICROSCOPIC
Bilirubin Urine: NEGATIVE
Glucose, UA: NEGATIVE mg/dL
Ketones, ur: NEGATIVE mg/dL
Nitrite: NEGATIVE
Protein, ur: NEGATIVE mg/dL
Specific Gravity, Urine: >1.03 — ABNORMAL HIGH (ref 1.005–1.030)
pH: 6 (ref 5.0–8.0)

## 2015-09-15 LAB — URINE MICROSCOPIC-ADD ON

## 2015-09-15 LAB — LIPASE, BLOOD: Lipase: 30 U/L (ref 11–51)

## 2015-09-15 MED ORDER — HYDROMORPHONE HCL 1 MG/ML IJ SOLN
1.0000 mg | Freq: Once | INTRAMUSCULAR | Status: AC
  Administered 2015-09-15: 1 mg via INTRAVENOUS
  Filled 2015-09-15: qty 1

## 2015-09-15 MED ORDER — PROMETHAZINE HCL 25 MG PO TABS: 25.0000 mg | ORAL_TABLET | Freq: Four times a day (QID) | ORAL | 1 refills | Status: DC | PRN

## 2015-09-15 MED ORDER — ONDANSETRON HCL 4 MG/2ML IJ SOLN
4.0000 mg | Freq: Once | INTRAMUSCULAR | Status: AC
  Administered 2015-09-15: 4 mg via INTRAVENOUS
  Filled 2015-09-15: qty 2

## 2015-09-15 MED ORDER — SODIUM CHLORIDE 0.9 % IV BOLUS (SEPSIS)
500.0000 mL | Freq: Once | INTRAVENOUS | Status: AC
  Administered 2015-09-15: 500 mL via INTRAVENOUS

## 2015-09-15 MED ORDER — IOPAMIDOL (ISOVUE-300) INJECTION 61%
100.0000 mL | Freq: Once | INTRAVENOUS | Status: AC | PRN
  Administered 2015-09-15: 100 mL via INTRAVENOUS

## 2015-09-15 MED ORDER — HYDROMORPHONE HCL 4 MG PO TABS: 4.0000 mg | ORAL_TABLET | Freq: Four times a day (QID) | ORAL | 0 refills | Status: DC | PRN

## 2015-09-15 MED ORDER — DIATRIZOATE MEGLUMINE & SODIUM 66-10 % PO SOLN
ORAL | Status: AC
  Filled 2015-09-15: qty 30

## 2015-09-15 MED ORDER — SODIUM CHLORIDE 0.9 % IV SOLN
INTRAVENOUS | Status: DC
  Administered 2015-09-15: 16:00:00 via INTRAVENOUS

## 2015-09-15 NOTE — ED Notes (Signed)
EDP at bedside  

## 2015-09-15 NOTE — ED Triage Notes (Signed)
Pt reports sharp mid abdominal pain that started 2 weeks ago. Dx with colon ca in May. Pt reports she has been taking meds at home but pain is getting worse.

## 2015-09-15 NOTE — Discharge Instructions (Signed)
Follow-up with GI medicine will be important to further evaluate the abnormality seen in the large intestines. This could represent a tumor. Colonoscopy is probably recommended. In the meantime take the hydromorphone as needed for pain in the Phenergan as needed for nausea. Make an appointment also follow-up with your regular doctor. Return for any new or worse symptoms.

## 2015-09-15 NOTE — ED Provider Notes (Signed)
[facility] DEPT Provider Note   CSN: [id number] Arrival date & time: 09/15/15  1338     History   Chief Complaint Chief Complaint  Patient presents with  . Abdominal Pain    HPI Heather [name] is a 50 y.o. female.  Patient with a two-week complaint of abdominal pain. All over. Associated with some loose bowel movements occasional nausea no vomiting. Maybe there was some red blood intermittently in the bowel movements. Patient states she has a history of colon cancer diagnosed in February but has had no further follow-up. Her primary care doctor is Dr. Wendi Snipes that is with [facility]. Patient states that the abdominal pain is 8 out of 10.'s constant in nature. Nonradiating.      Past Medical History:  Diagnosis Date  . Anxiety   . Cancer ([place])    colon  . Emphysema of lung ([place])   . GERD (gastroesophageal reflux disease)   . Tobacco abuse     Patient Active Problem List   Diagnosis Date Noted  . Numbness and tingling of right arm and leg 05/28/2015  . Right sided weakness   . Tobacco use disorder 03/17/2015  . Esophageal reflux 03/16/2015  . Vitamin D deficiency 02/03/2015  . Generalized anxiety disorder 02/03/2015  . Numbness 02/03/2015    Past Surgical History:  Procedure Laterality Date  . COLON SURGERY    . EXTERNAL EAR SURGERY    . SKIN GRAFT      OB History    Gravida Para Term Preterm AB Living             2   SAB TAB Ectopic Multiple Live Births                   Home Medications    Prior to Admission medications   Medication Sig Start Date End Date Taking? Authorizing Provider  escitalopram (LEXAPRO) 5 MG tablet Take 5 mg by mouth daily.   Yes Historical Provider, MD  gabapentin (NEURONTIN) 300 MG capsule Take 300 mg by mouth 2 (two) times daily.   Yes Historical Provider, MD  pantoprazole (PROTONIX) 40 MG tablet Take 1 tablet (40 mg total) by mouth daily. 02/14/15  Yes Jerene Bears, MD  rizatriptan (MAXALT) 10 MG  tablet Take 10 mg by mouth as needed for migraine. May repeat in 2 hours if needed   Yes Historical Provider, MD  HYDROmorphone (DILAUDID) 4 MG tablet Take 1 tablet (4 mg total) by mouth every 6 (six) hours as needed for severe pain. 09/15/15   Fredia Sorrow, MD  oxyCODONE-acetaminophen (PERCOCET/ROXICET) 5-325 MG tablet Take 1 tablet by mouth every 6 (six) hours as needed. Patient not taking: Reported on 09/15/2015 07/24/15   Milton Ferguson, MD  predniSONE (DELTASONE) 10 MG tablet Take 2 tablets (20 mg total) by mouth daily. Patient not taking: Reported on 09/15/2015 07/24/15   Milton Ferguson, MD  promethazine (PHENERGAN) 25 MG tablet Take 1 tablet (25 mg total) by mouth every 6 (six) hours as needed. 09/15/15   Fredia Sorrow, MD    Family History Family History  Problem Relation Age of Onset  . Heart failure Mother   . Colon cancer Mother 51  . Bladder Cancer Mother   . Heart disease Father   . Breast cancer Sister   . Heart disease Maternal Aunt   . Heart disease Maternal Uncle   . Colon cancer Paternal Aunt 71  . Bipolar disorder Son  Social History Social History  Substance Use Topics  . Smoking status: Current Every Day Smoker    Packs/day: 1.00    Years: 30.00    Types: Cigarettes  . Smokeless tobacco: Never Used     Comment: 02-08-2015 info given  . Alcohol use No     Allergies   Aleve [naproxen]; Asa [aspirin]; Excedrin [aspirin-acetaminophen-caffeine]; and Vicodin [hydrocodone-acetaminophen]   Review of Systems Review of Systems  Constitutional: Negative for fever.  HENT: Negative for congestion.   Eyes: Negative for visual disturbance.  Respiratory: Negative for shortness of breath.   Cardiovascular: Negative for chest pain.  Gastrointestinal: Positive for abdominal pain, diarrhea and nausea. Negative for vomiting.  Genitourinary: Negative for dysuria.  Musculoskeletal: Negative for back pain.  Skin: Negative for rash.  Neurological: Negative for headaches.    Hematological: Does not bruise/bleed easily.  Psychiatric/Behavioral: Negative for confusion.     Physical Exam Updated Vital Signs BP 118/84 (BP Location: Right Arm)   Pulse 70   Temp 98 F (36.7 C) (Oral)   Resp 16   Ht 5\' 5"  (1.651 m)   Wt 74.8 kg   LMP 09/07/2015 (Exact Date)   SpO2 95%   BMI 27.46 kg/m   Physical Exam  Constitutional: She is oriented to person, place, and time. She appears well-developed and well-nourished. No distress.  HENT:  Head: Normocephalic and atraumatic.  Mouth/Throat: Oropharynx is clear and moist.  Eyes: EOM are normal. Pupils are equal, round, and reactive to light.  Neck: Normal range of motion. Neck supple.  Cardiovascular: Normal rate, regular rhythm and normal heart sounds.   Pulmonary/Chest: Effort normal and breath sounds normal. No respiratory distress.  Abdominal: Soft. Bowel sounds are normal. There is no tenderness.  Musculoskeletal: Normal range of motion. She exhibits no edema.  Neurological: She is alert and oriented to person, place, and time. No cranial nerve deficit. She exhibits normal muscle tone. Coordination normal.  Skin: Skin is warm.  Nursing note and vitals reviewed.    ED Treatments / Results  Labs (all labs ordered are listed, but only abnormal results are displayed) Labs Reviewed  CBC - Abnormal; Notable for the following:       Result Value   Hemoglobin 15.1 (*)    All other components within normal limits  URINALYSIS, ROUTINE W REFLEX MICROSCOPIC (NOT AT [facility]) - Abnormal; Notable for the following:    APPearance CLOUDY (*)    Specific Gravity, Urine >1.030 (*)    Hgb urine dipstick LARGE (*)    Leukocytes, UA SMALL (*)    All other components within normal limits  URINE MICROSCOPIC-ADD ON - Abnormal; Notable for the following:    Squamous Epithelial / LPF 6-30 (*)    Bacteria, UA MANY (*)    All other components within normal limits  URINE CULTURE  LIPASE, BLOOD  COMPREHENSIVE METABOLIC PANEL     EKG  EKG Interpretation None       Radiology Ct Abdomen Pelvis W Contrast  Result Date: 09/15/2015 CLINICAL DATA:  Mid abdominal pain starting 2 weeks ago, history of colon cancer EXAM: CT ABDOMEN AND PELVIS WITH CONTRAST TECHNIQUE: Multidetector CT imaging of the abdomen and pelvis was performed using the standard protocol following bolus administration of intravenous contrast. CONTRAST:  185mL ISOVUE-300 IOPAMIDOL (ISOVUE-300) INJECTION 61% COMPARISON:  None. FINDINGS: Lower chest: Images of the lung bases are unremarkable. Hepatobiliary: Enhanced liver shows no biliary ductal dilatation. There is subtle low-density lesion in right hepatic lobe axial image 15  measures 7 mm. This is indeterminate and further evaluation with MRI could be performed Pancreas: Enhanced pancreas is unremarkable. Spleen: Enhanced spleen is unremarkable. Adrenals/Urinary Tract: No adrenal gland mass. Kidneys show symmetrical enhancement. There is lobulated contour of the right kidney. Probable cortical scarring in upper pole medial aspect of the right kidney. There is mild right hydronephrosis. Delayed renal images shows bilateral renal symmetrical excretion. Bilateral visualized proximal ureter is unremarkable. Mild dilatation of right extrarenal pelvis. The urinary bladder is unremarkable. Stomach/Bowel: Oral contrast material was given to the patient. There is no small bowel obstruction. No thickened or dilated small bowel loops. No pericecal inflammation. Normal appendix is noted. No gastric outlet obstruction. The terminal ileum is unremarkable. Axial image 29 there is mild focal thickening of colonic wall in splenic flexure of the colon. This is confirmed in coronal image 57. Although findings may represent colonic spasm a neoplastic process cannot be entirely excluded. Correlation with colonoscopy is recommended. No distal colonic obstruction. Some colonic stool noted in rectosigmoid colon. Vascular/Lymphatic: No  aortic aneurysm. Atherosclerotic calcifications of abdominal aorta and iliac arteries. No retroperitoneal or mesenteric adenopathy. Reproductive: The uterus is anteflexed. There is a hemorrhagic follicle within right ovary measures 1.1 cm. A cyst within right ovary measures 2.7 cm. The left ovary is unremarkable. No pelvic free fluid is noted. Other: There is no ascites or free abdominal air. No inguinal adenopathy is noted. Musculoskeletal: No destructive bony lesions are noted. Sagittal images of the spine shows mild degenerative changes lower thoracic spine. IMPRESSION: 1. There is indeterminate 7 mm low-density lesion within liver. Further evaluation with MRI could be performed as clinically warranted. 2. There is mild right hydronephrosis. Lobulated contour of the right kidney and cortical scarring in upper pole medially. Bilateral renal symmetrical excretion. No proximal ureteral obstruction. 3. No small bowel obstruction. 4. No pericecal inflammation.  Normal appendix. 5. Axial image 29 there is mild focal thickening of colonic wall in splenic flexure of the colon. This is confirmed in coronal image 57. Although findings may represent colonic spasm a neoplastic process cannot be entirely excluded. Correlation with colonoscopy is recommended. 6. There is a hemorrhagic follicle with right ovary measures 1.1 cm. Cyst within right ovary measures 2.7 cm. No pelvic free fluid. Electronically Signed   By: Lahoma Crocker M.D.   On: 09/15/2015 18:00    Procedures Procedures (including critical care time)  Medications Ordered in ED Medications  0.9 %  sodium chloride infusion ( Intravenous Stopped 09/15/15 1937)  diatrizoate meglumine-sodium (GASTROGRAFIN) 66-10 % solution (not administered)  sodium chloride 0.9 % bolus 500 mL (0 mLs Intravenous Stopped 09/15/15 1816)  ondansetron (ZOFRAN) injection 4 mg (4 mg Intravenous Given 09/15/15 1602)  HYDROmorphone (DILAUDID) injection 1 mg (1 mg Intravenous Given 09/15/15  1602)  iopamidol (ISOVUE-300) 61 % injection 100 mL (100 mLs Intravenous Contrast Given 09/15/15 1720)     Initial Impression / Assessment and Plan / ED Course  I have reviewed the triage vital signs and the nursing notes.  Pertinent labs & imaging results that were available during my care of the patient were reviewed by me and considered in my medical decision making (see chart for details).  Clinical Course    CT scan raises concern for abnormality in the large intestine that could represent a neoplasm. Patient is aware of this. No other acute findings. Patient will need follow-up with GI medicine and a colonoscopy. Patient also will need to follow-up with her regular doctor. In the meantime she'll  be treated with hydromorphone and Phenergan. Patient's labs without significant abnormalities.  Urine sent for culture. Patient will be notified if it's consistent with a urinary tract infection.  Final Clinical Impressions(s) / ED Diagnoses   Final diagnoses:  Generalized abdominal pain    New Prescriptions New Prescriptions   HYDROMORPHONE (DILAUDID) 4 MG TABLET    Take 1 tablet (4 mg total) by mouth every 6 (six) hours as needed for severe pain.   PROMETHAZINE (PHENERGAN) 25 MG TABLET    Take 1 tablet (25 mg total) by mouth every 6 (six) hours as needed.     Fredia Sorrow, MD 09/15/15 2010

## 2015-09-17 ENCOUNTER — Other Ambulatory Visit: Payer: Self-pay | Admitting: Pediatrics

## 2015-09-17 ENCOUNTER — Other Ambulatory Visit: Payer: Self-pay | Admitting: Family Medicine

## 2015-09-17 DIAGNOSIS — G629 Polyneuropathy, unspecified: Secondary | ICD-10-CM

## 2015-09-18 ENCOUNTER — Telehealth: Payer: Self-pay | Admitting: Internal Medicine

## 2015-09-18 ENCOUNTER — Ambulatory Visit (INDEPENDENT_AMBULATORY_CARE_PROVIDER_SITE_OTHER): Payer: Self-pay | Admitting: Family Medicine

## 2015-09-18 ENCOUNTER — Encounter: Payer: Self-pay | Admitting: Family Medicine

## 2015-09-18 VITALS — BP 121/83 | HR 87 | Temp 97.9°F | Ht 65.0 in | Wt 176.2 lb

## 2015-09-18 DIAGNOSIS — R1084 Generalized abdominal pain: Secondary | ICD-10-CM

## 2015-09-18 LAB — URINE CULTURE

## 2015-09-18 MED ORDER — OXYCODONE-ACETAMINOPHEN 7.5-325 MG PO TABS: 1.0000 | ORAL_TABLET | Freq: Three times a day (TID) | ORAL | 0 refills | Status: DC | PRN

## 2015-09-18 NOTE — Patient Instructions (Addendum)
Great to see you!  We will help you get an appt with Dr. Hilarie Fredrickson  Take the pain medications only as needed

## 2015-09-18 NOTE — Progress Notes (Signed)
   HPI  Patient presents today here for emergency room follow-up for abdominal pain.  Patient reports approximately 2 week history of generalized abdominal pain She's also had some associated loose stools. She denies any nausea or vomiting. She states that food and eating makes it worse and also cause increased loose stools. She is tolerating fluids.  She was given Dilaudid in the emergency room and told, per her report, that she has colon cancer. Patient also states that she was told she had colon cancer in June by her GI doctor. On review of the chart she had a colonoscopy in April of this year with incomplete cleanout, she had 3 polyps, however she did not have any signs of colon cancer.  Patient states that the only way that she has made it is due to the Dilaudid.  Patient explains that she only has allergy to hydrocodone.  She states that she is breathing well, however she does not have her balance back still. She was previously stating that she had a stroke, despite me explaining that her MRI did not support this. She was seen by neurology who has ordered EMGs and independently agrees after reviewing her MRI as well.   PMH: Smoking status noted ROS: Per HPI  Objective: BP 121/83   Pulse 87   Temp 97.9 F (36.6 C) (Oral)   Ht 5\' 5"  (1.651 m)   Wt 176 lb 3.2 oz (79.9 kg)   LMP 09/07/2015 (Exact Date)   BMI 29.32 kg/m  Gen: NAD, alert, cooperative with exam HEENT: NCAT CV: RRR, good S1/S2, no murmur Resp: CTABL, no wheezes, non-labored Abd: Soft, tenderness to palp throughout, positive bowel sounds, no guarding or Ext: No edema, warm Neuro: Alert and oriented, walking with a cane  Assessment and plan:  # Abdominal pain Possible transverse colon mass on CT scan, patient's been controlled with dilaudid from the emergency room. I have de-escalated pain medication slightly with oxycodone 7.5 mg TID PRN, I have given 14 days supply Urgent appt made with GI, Appreciate GI  working her in.  She also has a liver lesion that is unclear in character, I think the transverse  colon (likely C scope) is characterization is the first step.  Consider MRI depending on C scope results   Meds ordered this encounter  Medications  . DISCONTD: oxyCODONE-acetaminophen (PERCOCET) 7.5-325 MG tablet    Sig: Take 1 tablet by mouth every 8 (eight) hours as needed for severe pain.    Dispense:  30 tablet    Refill:  0  . oxyCODONE-acetaminophen (PERCOCET) 7.5-325 MG tablet    Sig: Take 1 tablet by mouth every 8 (eight) hours as needed for severe pain.    Dispense:  42 tablet    Refill:  0    Laroy Apple, MD [facility] 09/18/2015, 3:42 PM

## 2015-09-18 NOTE — Telephone Encounter (Signed)
Pt had abnormal CT scan result, needs colonoscopy. Pt scheduled to see Alonza Bogus PA 09/20/15@1 :30pm. [facility] to notify pt of appt.

## 2015-09-20 ENCOUNTER — Encounter: Payer: Self-pay | Admitting: Gastroenterology

## 2015-09-20 ENCOUNTER — Ambulatory Visit (INDEPENDENT_AMBULATORY_CARE_PROVIDER_SITE_OTHER): Payer: Self-pay | Admitting: Gastroenterology

## 2015-09-20 VITALS — BP 112/70 | HR 84 | Ht 64.5 in | Wt 174.5 lb

## 2015-09-20 DIAGNOSIS — R1084 Generalized abdominal pain: Secondary | ICD-10-CM

## 2015-09-20 DIAGNOSIS — R933 Abnormal findings on diagnostic imaging of other parts of digestive tract: Secondary | ICD-10-CM

## 2015-09-20 DIAGNOSIS — K7689 Other specified diseases of liver: Secondary | ICD-10-CM

## 2015-09-20 DIAGNOSIS — R1031 Right lower quadrant pain: Secondary | ICD-10-CM | POA: Insufficient documentation

## 2015-09-20 DIAGNOSIS — R1032 Left lower quadrant pain: Secondary | ICD-10-CM | POA: Insufficient documentation

## 2015-09-20 DIAGNOSIS — N83201 Unspecified ovarian cyst, right side: Secondary | ICD-10-CM

## 2015-09-20 DIAGNOSIS — N939 Abnormal uterine and vaginal bleeding, unspecified: Secondary | ICD-10-CM

## 2015-09-20 DIAGNOSIS — K769 Liver disease, unspecified: Secondary | ICD-10-CM

## 2015-09-20 MED ORDER — DICYCLOMINE HCL 10 MG PO CAPS: 10.0000 mg | ORAL_CAPSULE | Freq: Three times a day (TID) | ORAL | 1 refills | Status: DC

## 2015-09-20 NOTE — Progress Notes (Addendum)
[id number] Heather [name] [id number] [birth year]   History of Present Illness:  This is a 50 year old female who is known to Dr. Hilarie Fredrickson for colonoscopy in April of this year. Colonoscopy was incomplete due to stool throughout the colon. She did have 3 polyps removed that were hyperplastic on pathology.  It was recommended that she have a repeat procedure with 2 day bowel prep, however, due to her incomplete study.  She has not yet had that performed, but presents back to our office today to follow-up on an abnormal CT scan. She tells me that she's been having diffuse abdominal pain for which she went to the emergency department on September 9. There she had a CT scan of abdomen and pelvis with contrast that showed the following:  IMPRESSION: 1. There is indeterminate 7 mm low-density lesion within liver. Further evaluation with MRI could be performed as clinically warranted. 2. There is mild right hydronephrosis. Lobulated contour of the right kidney and cortical scarring in upper pole medially. Bilateral renal symmetrical excretion. No proximal ureteral obstruction. 3. No small bowel obstruction. 4. No pericecal inflammation.  Normal appendix. 5. Axial image 29 there is mild focal thickening of colonic wall in splenic flexure of the colon. This is confirmed in coronal image 57. Although findings may represent colonic spasm a neoplastic process cannot be entirely excluded. Correlation with colonoscopy is recommended. 6. There is a hemorrhagic follicle with right ovary measures 1.1 cm. Cyst within right ovary measures 2.7 cm. No pelvic free fluid.   She says that this pain has been going on for a while because she was initially started on gabapentin 300 mg twice daily, which initially helped with her pain, however, it has worsened which led to her emergency room visit.  She also tells me while she is here that she has been having abnormal vaginal bleeding. She does not see a  gynecologist as her primary care has always performed her yearly Pap smears.  Current Medications, Allergies, Past Medical History, Past Surgical History, Family History and Social History were reviewed in [employer] record.   Physical Exam: BP 112/70 (BP Location: Left Arm, Patient Position: Sitting, Cuff Size: Normal)   Pulse 84   Ht 5' 4.5" (1.638 m) Comment: height measured without shoes  Wt 174 lb 8 oz (79.2 kg)   LMP 09/07/2015 (Exact Date)   BMI 29.49 kg/m  General: Well developed white female in no acute distress Head: Normocephalic and atraumatic Eyes:  Sclerae anicteric, conjunctiva pink  Ears: Normal auditory acuity Lungs: Clear throughout to auscultation Heart: Regular rate and rhythm Abdomen: Soft, non-distended.  Normal bowel sounds.  Diffuse TTP. Musculoskeletal: Symmetrical with no gross deformities  Extremities: No edema  Neurological: Alert oriented x 4, grossly non-focal Psychological:  Alert and cooperative. Normal mood and affect  Assessment and Recommendations: -Abnormal CT scan of the colon:  Suggests possible spasm, but neoplastic process could not be excluded at the splenic flexure.  Previous colonoscopy incomplete in 04/2015 and repeat was recommended.  Will schedule with Dr. Hilarie Fredrickson with 2 day bowel prep. -Abdominal pain, generalized:  ? Source.  Unsure if GI related vs GYN vs other.  Will try Bentyl 10 mg TID for now. -Right ovarian cyst and abnormal vaginal bleeding:  Needs GYN referral.   -Liver lesion:  Indeterminate 7 mm low-density lesion seen on CT scan.  Recommending MRI follow-up to further characterize.  Will schedule.    *The risks, benefits, and alternatives to colonoscopy were  discussed with the patient and she consents to proceed.   Addendum: Reviewed and agree with management. Colonoscopy was recommended after April colonoscopy due to inadequate prep.  Now scheduled with 2 day prep. Await MRI liver Jerene Bears,  MD

## 2015-09-20 NOTE — Patient Instructions (Signed)
You have been scheduled for a colonoscopy. Please follow written instructions given to you at your visit today.  Please pick up your prep supplies at the pharmacy within the next 1-3 days. If you use inhalers (even only as needed), please bring them with you on the day of your procedure. Your physician has requested that you go to www.startemmi.com and enter the access code given to you at your visit today. This web site gives a general overview about your procedure. However, you should still follow specific instructions given to you by our office regarding your preparation for the procedure.  We sent a prescription to [facility] for Bentyl 10 mg.    We are making a referral to a gynecologist.  We will call you.  We have given you a form for Financial Assistance with [facility].  Fill it out and mail it in with your tax form.

## 2015-09-24 ENCOUNTER — Telehealth: Payer: Self-pay | Admitting: *Deleted

## 2015-09-24 NOTE — Telephone Encounter (Signed)
-----   Message from Loralie Champagne, PA-C sent at 09/20/2015  3:53 PM EDT ----- Jeannene Patella,  I think that I forgot to tell you that this patient also needs an MRI abdomen with contrast to evaluate and further characterize a liver lesion that was seen on CT scan.  I had told her that we were going to schedule but I think that I forgot to mark it on my list.  Would you please schedule this for her as well?  I know she does not have insurance right now, but I think that we still need to try to proceed if possible.  Thank you,  Jess

## 2015-09-24 NOTE — Telephone Encounter (Signed)
Pt said she is returning your call about her MRI

## 2015-09-24 NOTE — Telephone Encounter (Signed)
Called the patient today 9-18 to advise, we did schedule an MRI of abdomen.  Date is 09-28-2015. She is to arrive at 11:45 am for a 12:00 appointment.  The location is across the street from our office.  [place].  I advised the patient she can call me if she has any quesstions.

## 2015-09-24 NOTE — Telephone Encounter (Signed)
Changed the location of the MRI to [facility] radiology from 9-22 at 10 am.. She is to arrive at 9:45 am.  Patient informed. She requested the change.

## 2015-09-28 ENCOUNTER — Ambulatory Visit (HOSPITAL_COMMUNITY): Payer: Self-pay

## 2015-09-28 ENCOUNTER — Ambulatory Visit (HOSPITAL_COMMUNITY): Admission: RE | Admit: 2015-09-28 | Payer: Self-pay | Source: Ambulatory Visit

## 2015-09-29 ENCOUNTER — Encounter (HOSPITAL_COMMUNITY): Payer: Self-pay

## 2015-09-29 ENCOUNTER — Emergency Department (HOSPITAL_COMMUNITY): Payer: Self-pay

## 2015-09-29 ENCOUNTER — Emergency Department (HOSPITAL_COMMUNITY)
Admission: EM | Admit: 2015-09-29 | Discharge: 2015-09-29 | Disposition: A | Discharge status: Home / Self Care | Payer: Self-pay | Attending: Emergency Medicine | Admitting: Emergency Medicine

## 2015-09-29 DIAGNOSIS — Z79899 Other long term (current) drug therapy: Secondary | ICD-10-CM | POA: Insufficient documentation

## 2015-09-29 DIAGNOSIS — N760 Acute vaginitis: Secondary | ICD-10-CM

## 2015-09-29 DIAGNOSIS — F17210 Nicotine dependence, cigarettes, uncomplicated: Secondary | ICD-10-CM | POA: Insufficient documentation

## 2015-09-29 DIAGNOSIS — R109 Unspecified abdominal pain: Secondary | ICD-10-CM

## 2015-09-29 DIAGNOSIS — B9689 Other specified bacterial agents as the cause of diseases classified elsewhere: Secondary | ICD-10-CM

## 2015-09-29 DIAGNOSIS — A599 Trichomoniasis, unspecified: Secondary | ICD-10-CM

## 2015-09-29 DIAGNOSIS — A5901 Trichomonal vulvovaginitis: Secondary | ICD-10-CM | POA: Insufficient documentation

## 2015-09-29 HISTORY — DX: Gastritis, unspecified, without bleeding: K29.70

## 2015-09-29 HISTORY — DX: Headache: R51

## 2015-09-29 HISTORY — DX: Other chronic pain: G89.29

## 2015-09-29 HISTORY — DX: Other chronic pain: R51.9

## 2015-09-29 HISTORY — DX: Polyp of colon: K63.5

## 2015-09-29 LAB — WET PREP, GENITAL
SPERM: NONE SEEN
Yeast Wet Prep HPF POC: NONE SEEN

## 2015-09-29 LAB — CBC WITH DIFFERENTIAL/PLATELET
Basophils Absolute: 0 10*3/uL (ref 0.0–0.1)
Basophils Relative: 0 %
Eosinophils Absolute: 0.3 10*3/uL (ref 0.0–0.7)
Eosinophils Relative: 4 %
HEMATOCRIT: 39.3 % (ref 36.0–46.0)
HEMOGLOBIN: 14 g/dL (ref 12.0–15.0)
LYMPHS ABS: 1.5 10*3/uL (ref 0.7–4.0)
LYMPHS PCT: 22 %
MCH: 31.3 pg (ref 26.0–34.0)
MCHC: 35.6 g/dL (ref 30.0–36.0)
MCV: 87.7 fL (ref 78.0–100.0)
MONOS PCT: 5 %
Monocytes Absolute: 0.3 10*3/uL (ref 0.1–1.0)
NEUTROS ABS: 4.9 10*3/uL (ref 1.7–7.7)
NEUTROS PCT: 69 %
Platelets: 234 10*3/uL (ref 150–400)
RBC: 4.48 MIL/uL (ref 3.87–5.11)
RDW: 13.3 % (ref 11.5–15.5)
WBC: 7.1 10*3/uL (ref 4.0–10.5)

## 2015-09-29 LAB — COMPREHENSIVE METABOLIC PANEL
ALBUMIN: 3.8 g/dL (ref 3.5–5.0)
ALK PHOS: 56 U/L (ref 38–126)
ALT: 18 U/L (ref 14–54)
ANION GAP: 7 (ref 5–15)
AST: 20 U/L (ref 15–41)
BUN: 11 mg/dL (ref 6–20)
CALCIUM: 9 mg/dL (ref 8.9–10.3)
CHLORIDE: 107 mmol/L (ref 101–111)
CO2: 24 mmol/L (ref 22–32)
Creatinine, Ser: 0.68 mg/dL (ref 0.44–1.00)
GFR calc Af Amer: >60 mL/min (ref >60)
GFR calc non Af Amer: >60 mL/min (ref >60)
GLUCOSE: 93 mg/dL (ref 65–99)
POTASSIUM: 3.6 mmol/L (ref 3.5–5.1)
SODIUM: 138 mmol/L (ref 135–145)
Total Bilirubin: 0.5 mg/dL (ref 0.3–1.2)
Total Protein: 6.6 g/dL (ref 6.5–8.1)

## 2015-09-29 LAB — URINALYSIS, ROUTINE W REFLEX MICROSCOPIC
BILIRUBIN URINE: NEGATIVE
Glucose, UA: NEGATIVE mg/dL
HGB URINE DIPSTICK: NEGATIVE
Ketones, ur: NEGATIVE mg/dL
Nitrite: NEGATIVE
PH: 7 (ref 5.0–8.0)
Protein, ur: NEGATIVE mg/dL
SPECIFIC GRAVITY, URINE: 1.01 (ref 1.005–1.030)

## 2015-09-29 LAB — URINE MICROSCOPIC-ADD ON

## 2015-09-29 LAB — PREGNANCY, URINE: Preg Test, Ur: NEGATIVE

## 2015-09-29 LAB — LIPASE, BLOOD: Lipase: 23 U/L (ref 11–51)

## 2015-09-29 MED ORDER — FENTANYL CITRATE (PF) 100 MCG/2ML IJ SOLN
50.0000 ug | INTRAMUSCULAR | Status: DC | PRN
Start: 2015-09-29 — End: 2015-09-29
  Administered 2015-09-29: 50 ug via INTRAVENOUS
  Filled 2015-09-29: qty 2

## 2015-09-29 MED ORDER — ONDANSETRON HCL 4 MG/2ML IJ SOLN
4.0000 mg | INTRAMUSCULAR | Status: DC | PRN
  Administered 2015-09-29: 4 mg via INTRAVENOUS
  Filled 2015-09-29: qty 2

## 2015-09-29 MED ORDER — STERILE WATER FOR INJECTION IJ SOLN
INTRAMUSCULAR | Status: AC
  Filled 2015-09-29: qty 10

## 2015-09-29 MED ORDER — FAMOTIDINE IN NACL 20-0.9 MG/50ML-% IV SOLN
20.0000 mg | Freq: Once | INTRAVENOUS | Status: AC
  Administered 2015-09-29: 20 mg via INTRAVENOUS
  Filled 2015-09-29: qty 50

## 2015-09-29 MED ORDER — PROMETHAZINE HCL 25 MG PO TABS: 25.0000 mg | ORAL_TABLET | Freq: Four times a day (QID) | ORAL | 0 refills | Status: DC | PRN

## 2015-09-29 MED ORDER — DICYCLOMINE HCL 10 MG/ML IM SOLN
20.0000 mg | Freq: Once | INTRAMUSCULAR | Status: AC
Start: 2015-09-29 — End: 2015-09-29
  Administered 2015-09-29: 20 mg via INTRAMUSCULAR
  Filled 2015-09-29: qty 2

## 2015-09-29 MED ORDER — AZITHROMYCIN 250 MG PO TABS
1000.0000 mg | ORAL_TABLET | Freq: Once | ORAL | Status: AC
  Administered 2015-09-29: 1000 mg via ORAL
  Filled 2015-09-29: qty 4

## 2015-09-29 MED ORDER — SODIUM CHLORIDE 0.9 % IV BOLUS (SEPSIS)
1000.0000 mL | Freq: Once | INTRAVENOUS | Status: AC
  Administered 2015-09-29: 1000 mL via INTRAVENOUS

## 2015-09-29 MED ORDER — CEFTRIAXONE SODIUM 250 MG IJ SOLR
250.0000 mg | Freq: Once | INTRAMUSCULAR | Status: AC
  Administered 2015-09-29: 250 mg via INTRAMUSCULAR
  Filled 2015-09-29: qty 250

## 2015-09-29 MED ORDER — METRONIDAZOLE 500 MG PO TABS: 500.0000 mg | ORAL_TABLET | Freq: Two times a day (BID) | ORAL | 0 refills | Status: DC

## 2015-09-29 NOTE — ED Provider Notes (Signed)
[facility] DEPT Provider Note   CSN: [id number] Arrival date & time: 09/29/15  1238     History   Chief Complaint Chief Complaint  Patient presents with  . Abdominal Pain    HPI Heather [name] is a 50 y.o. female.  HPI  Pt was seen at 1250.  Per pt, c/o gradual onset and persistence of constant generalized abd "pain" for the past 1 month.  Has been associated with multiple intermittent episodes of N/V/D.  Describes the abd pain as "aching."  Denies fevers, no back pain, no rash, no CP/SOB, no black or blood in stools or emesis.  Pt also c/o "having a shorter period than usual" 3 weeks ago and is "worried that the tumor in my colon is causing the bleeding from my vagina." Denies vaginal discharge, no dysuria/hematuria. The symptoms have been associated with no other complaints. The patient has a significant history of similar symptoms previously, recently being evaluated for this complaint and multiple prior evals for same. Pt has been evaluated in the ED, by her PMD and GI MD in the past 2 weeks and pt has MRI abd scheduled for 10/05/15.     Past Medical History:  Diagnosis Date  . Anxiety   . Chronic headache   . Emphysema of lung ([place])   . Gastritis   . GERD (gastroesophageal reflux disease)   . Polyp of colon 04/2015   benign  . Tobacco abuse     Patient Active Problem List   Diagnosis Date Noted  . Abnormal vaginal bleeding 09/20/2015  . Cyst of right ovary 09/20/2015  . Right lower quadrant abdominal pain 09/20/2015  . Abdominal pain, left lower quadrant 09/20/2015  . Generalized abdominal pain 09/20/2015  . Abnormal CT scan, colon 09/20/2015  . Liver lesion 09/20/2015  . Numbness and tingling of right arm and leg 05/28/2015  . Right sided weakness   . Tobacco use disorder 03/17/2015  . Esophageal reflux 03/16/2015  . Vitamin D deficiency 02/03/2015  . Generalized anxiety disorder 02/03/2015  . Numbness 02/03/2015    Past Surgical History:  Procedure  Laterality Date  . COLON SURGERY    . EXTERNAL EAR SURGERY    . SKIN GRAFT      OB History    Gravida Para Term Preterm AB Living             2   SAB TAB Ectopic Multiple Live Births                   Home Medications    Prior to Admission medications   Medication Sig Start Date End Date Taking? Authorizing Provider  dicyclomine (BENTYL) 10 MG capsule Take 1 capsule (10 mg total) by mouth 4 (four) times daily -  before meals and at bedtime. 09/20/15   Jessica D Zehr, PA-C  escitalopram (LEXAPRO) 5 MG tablet TAKE 1 TABLET (5 MG TOTAL) BY MOUTH DAILY. 09/17/15   Timmothy Euler, MD  gabapentin (NEURONTIN) 300 MG capsule TAKE 1 CAPSULE (300 MG TOTAL) BY MOUTH 2 (TWO) TIMES DAILY. 09/17/15   Timmothy Euler, MD  oxyCODONE-acetaminophen (PERCOCET) 7.5-325 MG tablet Take 1 tablet by mouth every 8 (eight) hours as needed for severe pain. 09/18/15   Timmothy Euler, MD  pantoprazole (PROTONIX) 40 MG tablet Take 1 tablet (40 mg total) by mouth daily. 02/14/15   Jerene Bears, MD  promethazine (PHENERGAN) 25 MG tablet Take 1 tablet (25 mg total) by mouth every 6 (six)  hours as needed. 09/15/15   Fredia Sorrow, MD  rizatriptan (MAXALT) 10 MG tablet Take 10 mg by mouth as needed for migraine. May repeat in 2 hours if needed    Historical Provider, MD    Family History Family History  Problem Relation Age of Onset  . Heart failure Mother   . Colon cancer Mother 28  . Bladder Cancer Mother   . Heart disease Father   . Breast cancer Sister   . Heart disease Maternal Aunt   . Heart disease Maternal Uncle   . Colon cancer Paternal Aunt 29  . Bipolar disorder Son     Social History Social History  Substance Use Topics  . Smoking status: Current Every Day Smoker    Packs/day: 1.00    Years: 30.00    Types: Cigarettes  . Smokeless tobacco: Never Used     Comment: 02-08-2015 info given  . Alcohol use No     Allergies   Aleve [naproxen]; Asa [aspirin]; Excedrin  [aspirin-acetaminophen-caffeine]; and Vicodin [hydrocodone-acetaminophen]   Review of Systems Review of Systems ROS: Statement: All systems negative except as marked or noted in the HPI; Constitutional: Negative for fever and chills. ; ; Eyes: Negative for eye pain, redness and discharge. ; ; ENMT: Negative for ear pain, hoarseness, nasal congestion, sinus pressure and sore throat. ; ; Cardiovascular: Negative for chest pain, palpitations, diaphoresis, dyspnea and peripheral edema. ; ; Respiratory: Negative for cough, wheezing and stridor. ; ; Gastrointestinal: +N/V/D, abd pain. Negative for blood in stool, hematemesis, jaundice and rectal bleeding. . ; ; Genitourinary: Negative for dysuria, flank pain and hematuria. ; ; GYN:  No pelvic pain, no vaginal bleeding, no vaginal discharge, no vulvar pain. ;; Musculoskeletal: Negative for back pain and neck pain. Negative for swelling and trauma.; ; Skin: Negative for pruritus, rash, abrasions, blisters, bruising and skin lesion.; ; Neuro: Negative for headache, lightheadedness and neck stiffness. Negative for weakness, altered level of consciousness, altered mental status, extremity weakness, paresthesias, involuntary movement, seizure and syncope.      Physical Exam Updated Vital Signs BP 113/72 (BP Location: Left Arm)   Pulse 63   Temp 98.9 F (37.2 C) (Oral)   Resp 17   Ht 5\' 5"  (1.651 m)   Wt 176 lb (79.8 kg)   LMP 09/07/2015 (Exact Date)   SpO2 96%   BMI 29.29 kg/m   Physical Exam 1255: Physical examination:  Nursing notes reviewed; Vital signs and O2 SAT reviewed;  Constitutional: Well developed, Well nourished, Well hydrated, In no acute distress; Head:  Normocephalic, atraumatic; Eyes: EOMI, PERRL, No scleral icterus; ENMT: Mouth and pharynx normal, Mucous membranes moist; Neck: Supple, Full range of motion, No lymphadenopathy; Cardiovascular: Regular rate and rhythm, No gallop; Respiratory: Breath sounds clear & equal bilaterally, No  wheezes.  Speaking full sentences with ease, Normal respiratory effort/excursion; Chest: Nontender, Movement normal; Abdomen: Soft, Nontender when distracted. Nondistended, Normal bowel sounds; Genitourinary: No CVA tenderness; Extremities: Pulses normal, No tenderness, No edema, No calf edema or asymmetry.; Neuro: AA&Ox3, Major CN grossly intact.  Speech clear. No gross focal motor or sensory deficits in extremities.; Skin: Color normal, Warm, Dry.; Psych:  Affect flat.    ED Treatments / Results  Labs (all labs ordered are listed, but only abnormal results are displayed)   EKG  EKG Interpretation None       Radiology   Procedures Procedures (including critical care time)  Medications Ordered in ED Medications  ondansetron (ZOFRAN) injection 4 mg (4  mg Intravenous Given 09/29/15 1311)  fentaNYL (SUBLIMAZE) injection 50 mcg (50 mcg Intravenous Given 09/29/15 1311)  famotidine (PEPCID) IVPB 20 mg premix (not administered)  dicyclomine (BENTYL) injection 20 mg (not administered)  sodium chloride 0.9 % bolus 1,000 mL (1,000 mLs Intravenous New Bag/Given 09/29/15 1310)     Initial Impression / Assessment and Plan / ED Course  I have reviewed the triage vital signs and the nursing notes.  Pertinent labs & imaging results that were available during my care of the patient were reviewed by me and considered in my medical decision making (see chart for details).  MDM Reviewed: previous chart, nursing note and vitals Reviewed previous: labs and CT scan Interpretation: labs and x-ray   Results for orders placed or performed during the hospital encounter of 09/29/15  Wet prep, genital  Result Value Ref Range   Yeast Wet Prep HPF POC NONE SEEN NONE SEEN   Trich, Wet Prep PRESENT (A) NONE SEEN   Clue Cells Wet Prep HPF POC PRESENT (A) NONE SEEN   WBC, Wet Prep HPF POC MANY (A) NONE SEEN   Sperm NONE SEEN   Comprehensive metabolic panel  Result Value Ref Range   Sodium 138 135 - 145  mmol/L   Potassium 3.6 3.5 - 5.1 mmol/L   Chloride 107 101 - 111 mmol/L   CO2 24 22 - 32 mmol/L   Glucose, Bld 93 65 - 99 mg/dL   BUN 11 6 - 20 mg/dL   Creatinine, Ser 0.68 0.44 - 1.00 mg/dL   Calcium 9.0 8.9 - 10.3 mg/dL   Total Protein 6.6 6.5 - 8.1 g/dL   Albumin 3.8 3.5 - 5.0 g/dL   AST 20 15 - 41 U/L   ALT 18 14 - 54 U/L   Alkaline Phosphatase 56 38 - 126 U/L   Total Bilirubin 0.5 0.3 - 1.2 mg/dL   GFR calc non Af Amer >60 >60 mL/min   GFR calc Af Amer >60 >60 mL/min   Anion gap 7 5 - 15  Lipase, blood  Result Value Ref Range   Lipase 23 11 - 51 U/L  CBC with Differential  Result Value Ref Range   WBC 7.1 4.0 - 10.5 K/uL   RBC 4.48 3.87 - 5.11 MIL/uL   Hemoglobin 14.0 12.0 - 15.0 g/dL   HCT 39.3 36.0 - 46.0 %   MCV 87.7 78.0 - 100.0 fL   MCH 31.3 26.0 - 34.0 pg   MCHC 35.6 30.0 - 36.0 g/dL   RDW 13.3 11.5 - 15.5 %   Platelets 234 150 - 400 K/uL   Neutrophils Relative % 69 %   Neutro Abs 4.9 1.7 - 7.7 K/uL   Lymphocytes Relative 22 %   Lymphs Abs 1.5 0.7 - 4.0 K/uL   Monocytes Relative 5 %   Monocytes Absolute 0.3 0.1 - 1.0 K/uL   Eosinophils Relative 4 %   Eosinophils Absolute 0.3 0.0 - 0.7 K/uL   Basophils Relative 0 %   Basophils Absolute 0.0 0.0 - 0.1 K/uL  Urinalysis, Routine w reflex microscopic  Result Value Ref Range   Color, Urine YELLOW YELLOW   APPearance CLEAR CLEAR   Specific Gravity, Urine 1.010 1.005 - 1.030   pH 7.0 5.0 - 8.0   Glucose, UA NEGATIVE NEGATIVE mg/dL   Hgb urine dipstick NEGATIVE NEGATIVE   Bilirubin Urine NEGATIVE NEGATIVE   Ketones, ur NEGATIVE NEGATIVE mg/dL   Protein, ur NEGATIVE NEGATIVE mg/dL   Nitrite NEGATIVE NEGATIVE  Leukocytes, UA TRACE (A) NEGATIVE  Pregnancy, urine  Result Value Ref Range   Preg Test, Ur NEGATIVE NEGATIVE  Urine microscopic-add on  Result Value Ref Range   Squamous Epithelial / LPF 0-5 (A) NONE SEEN   WBC, UA 6-30 0 - 5 WBC/hpf   RBC / HPF 0-5 0 - 5 RBC/hpf   Bacteria, UA FEW (A) NONE SEEN    Urine-Other TRICHOMONAS PRESENT    Ct Abdomen Pelvis W Contrast Result Date: 09/15/2015 CLINICAL DATA:  Mid abdominal pain starting 2 weeks ago, history of colon cancer EXAM: CT ABDOMEN AND PELVIS WITH CONTRAST TECHNIQUE: Multidetector CT imaging of the abdomen and pelvis was performed using the standard protocol following bolus administration of intravenous contrast. CONTRAST:  131mL ISOVUE-300 IOPAMIDOL (ISOVUE-300) INJECTION 61% COMPARISON:  None. FINDINGS: Lower chest: Images of the lung bases are unremarkable. Hepatobiliary: Enhanced liver shows no biliary ductal dilatation. There is subtle low-density lesion in right hepatic lobe axial image 15 measures 7 mm. This is indeterminate and further evaluation with MRI could be performed Pancreas: Enhanced pancreas is unremarkable. Spleen: Enhanced spleen is unremarkable. Adrenals/Urinary Tract: No adrenal gland mass. Kidneys show symmetrical enhancement. There is lobulated contour of the right kidney. Probable cortical scarring in upper pole medial aspect of the right kidney. There is mild right hydronephrosis. Delayed renal images shows bilateral renal symmetrical excretion. Bilateral visualized proximal ureter is unremarkable. Mild dilatation of right extrarenal pelvis. The urinary bladder is unremarkable. Stomach/Bowel: Oral contrast material was given to the patient. There is no small bowel obstruction. No thickened or dilated small bowel loops. No pericecal inflammation. Normal appendix is noted. No gastric outlet obstruction. The terminal ileum is unremarkable. Axial image 29 there is mild focal thickening of colonic wall in splenic flexure of the colon. This is confirmed in coronal image 57. Although findings may represent colonic spasm a neoplastic process cannot be entirely excluded. Correlation with colonoscopy is recommended. No distal colonic obstruction. Some colonic stool noted in rectosigmoid colon. Vascular/Lymphatic: No aortic aneurysm.  Atherosclerotic calcifications of abdominal aorta and iliac arteries. No retroperitoneal or mesenteric adenopathy. Reproductive: The uterus is anteflexed. There is a hemorrhagic follicle within right ovary measures 1.1 cm. A cyst within right ovary measures 2.7 cm. The left ovary is unremarkable. No pelvic free fluid is noted. Other: There is no ascites or free abdominal air. No inguinal adenopathy is noted. Musculoskeletal: No destructive bony lesions are noted. Sagittal images of the spine shows mild degenerative changes lower thoracic spine. IMPRESSION: 1. There is indeterminate 7 mm low-density lesion within liver. Further evaluation with MRI could be performed as clinically warranted. 2. There is mild right hydronephrosis. Lobulated contour of the right kidney and cortical scarring in upper pole medially. Bilateral renal symmetrical excretion. No proximal ureteral obstruction. 3. No small bowel obstruction. 4. No pericecal inflammation.  Normal appendix. 5. Axial image 29 there is mild focal thickening of colonic wall in splenic flexure of the colon. This is confirmed in coronal image 57. Although findings may represent colonic spasm a neoplastic process cannot be entirely excluded. Correlation with colonoscopy is recommended. 6. There is a hemorrhagic follicle with right ovary measures 1.1 cm. Cyst within right ovary measures 2.7 cm. No pelvic free fluid. Electronically Signed   By: Lahoma Crocker M.D.   On: 09/15/2015 18:00   Dg Abd Acute W/chest Result Date: 09/29/2015 CLINICAL DATA:  Abdominal pain, vaginal bleeding, nausea, vomiting and diarrhea. EXAM: DG ABDOMEN ACUTE W/ 1V CHEST COMPARISON:  Chest radiographs dated 05/15/2015.  Abdomen and pelvis CT dated 09/15/2015. FINDINGS: Normal sized heart. Clear lungs. Normal bowel gas pattern without free peritoneal air. Mild levoconvex thoracolumbar rotary scoliosis. IMPRESSION: No acute abnormality. Electronically Signed   By: Claudie Revering M.D.   On: 09/29/2015  13:42     1255:  EPIC chart reviewed: PMD and GI notes do not indicate pt has a "colon tumor" and previous colon polyps were benign; there is also a note stating pt felt "dilaudid" was the only medication that helped with her pain, pt was rx percocet 7.5/325mg  tabs #42 by her PMD during office visit on 09/18/15. Reassured pt and explained irregular vaginal bleeding would not be due to "a colon tumor causing the bleeding from her vagina" (pt's concern). Given pt's benign abd and pelvic exams, do not feel repeat CT A/P or US pelvis is warranted today. Workup ordered.   1430:  On re-eval: I asked pt if she wanted her friend and significant other to stay in the room while I gave her her testing results, with one of the results being sensitive information. She said yes, it was OK that they stayed. I asked pt again, as well as stated again that one of the results was of a sensitive nature, if her friend and significant other stayed in the room. Pt again stated it was OK that they stayed. Dx and testing d/w pt.  Questions answered.  Verb understanding. Agreeable to have pelvic exam performed:  Pelvic exam performed with permission of pt and female ED tech assist during exam.  External genitalia w/o lesions. Vaginal vault with thin white discharge, no blood. Cervix w/o lesions, not friable, GC/chlam and wet prep obtained and sent to lab.  Bimanual exam w/o CMT or adnexal tenderness, +suprapubic tenderness.   1630:  Pt has tol PO well while in the ED without N/V. Will tx for GC/chlam empirically. No clear UTI on Udip and pt denies dysuria; UC is pending. Will tx for BV/trich with flagyl. Pt strongly encouraged to f/u with her GI MD and GYN MD as previously instructed; pt verb understanding. Dx and testing d/w pt and family.  Questions answered.  Verb understanding, agreeable to d/c home with outpt f/u.       Final Clinical Impressions(s) / ED Diagnoses   Final diagnoses:  None    New Prescriptions New  Prescriptions   No medications on file     Francine Graven, DO 10/02/15 1748

## 2015-09-29 NOTE — ED Triage Notes (Signed)
Complain of abdominal pain and vaginal bleeding for weeks. States the pain is worse now and she has had n/v/d. Pt states she was told the last visit she a tumor on her large intestine.

## 2015-09-29 NOTE — Discharge Instructions (Signed)
Take the prescriptions as directed.  Increase your fluid intake (ie:  Gatoraide) for the next few days.  Eat a bland diet and advance to your regular diet slowly as you can tolerate it. Call your regular medical doctor, GI doctor, and OB/GYN doctor on Monday to schedule a follow up appointment this week.  Return to the Emergency Department immediately sooner if worsening.

## 2015-10-01 ENCOUNTER — Telehealth: Payer: Self-pay | Admitting: Family Medicine

## 2015-10-01 ENCOUNTER — Other Ambulatory Visit: Payer: Self-pay | Admitting: *Deleted

## 2015-10-01 DIAGNOSIS — N939 Abnormal uterine and vaginal bleeding, unspecified: Secondary | ICD-10-CM

## 2015-10-01 LAB — GC/CHLAMYDIA PROBE AMP (~~LOC~~) NOT AT ARMC
Chlamydia: NEGATIVE
Neisseria Gonorrhea: NEGATIVE

## 2015-10-01 LAB — URINE CULTURE

## 2015-10-01 MED ORDER — OXYCODONE-ACETAMINOPHEN 7.5-325 MG PO TABS: 1.0000 | ORAL_TABLET | Freq: Two times a day (BID) | ORAL | 0 refills | Status: DC | PRN

## 2015-10-01 NOTE — Telephone Encounter (Signed)
Patient aware and referral placed.

## 2015-10-01 NOTE — Telephone Encounter (Signed)
Refilled, continue to wean, no clear indication/etiology found for abd pain.   Pt to have MRI to characterize liver lesion this week, tretaed in ED for [facility] emperically after being fourn to have trich.   Cannot exclude colon ca yet, having c scope soon.   needs GYN for vaginal bleeding.   Laroy Apple, MD [facility] 10/01/2015, 11:51 AM

## 2015-10-05 ENCOUNTER — Ambulatory Visit (HOSPITAL_COMMUNITY)
Admission: RE | Admit: 2015-10-05 | Discharge: 2015-10-05 | Disposition: A | Discharge status: Home / Self Care | Payer: Self-pay | Source: Ambulatory Visit | Attending: Gastroenterology | Admitting: Gastroenterology

## 2015-10-05 DIAGNOSIS — K769 Liver disease, unspecified: Secondary | ICD-10-CM | POA: Insufficient documentation

## 2015-10-05 DIAGNOSIS — R1084 Generalized abdominal pain: Secondary | ICD-10-CM | POA: Insufficient documentation

## 2015-10-05 DIAGNOSIS — D1803 Hemangioma of intra-abdominal structures: Secondary | ICD-10-CM | POA: Insufficient documentation

## 2015-10-05 MED ORDER — GADOBENATE DIMEGLUMINE 529 MG/ML IV SOLN
20.0000 mL | Freq: Once | INTRAVENOUS | Status: AC | PRN
  Administered 2015-10-05: 16 mL via INTRAVENOUS

## 2015-10-08 ENCOUNTER — Encounter: Payer: Self-pay | Admitting: Family Medicine

## 2015-10-08 ENCOUNTER — Ambulatory Visit (INDEPENDENT_AMBULATORY_CARE_PROVIDER_SITE_OTHER): Payer: Self-pay | Admitting: Family Medicine

## 2015-10-08 ENCOUNTER — Telehealth: Payer: Self-pay

## 2015-10-08 VITALS — BP 119/79 | HR 92 | Temp 97.3°F | Ht 65.0 in | Wt 173.2 lb

## 2015-10-08 DIAGNOSIS — R1013 Epigastric pain: Secondary | ICD-10-CM

## 2015-10-08 MED ORDER — OXYCODONE-ACETAMINOPHEN 5-325 MG PO TABS: 1.0000 | ORAL_TABLET | Freq: Two times a day (BID) | ORAL | 0 refills | Status: DC | PRN

## 2015-10-08 NOTE — Patient Instructions (Signed)
Great to see you!  Please try to cut back on the pain medications as much as he can.  If your pain suddenly worsens, is unbearable or uncontrolled by medications, or if you're not able to tolerate foods or fluids by mouth please seek emergency medical care.

## 2015-10-08 NOTE — Telephone Encounter (Signed)
-----   Message from Loralie Champagne, PA-C sent at 10/06/2015 12:19 PM EDT ----- Please let the patient know that her MRI of her liver just shows a clump of blood vessels that we call a hemangioma.  That does not cause any symptoms or issues. No need for any further testing in this regards. Please proceed with colonoscopy as already scheduled with Dr. Hilarie Fredrickson on October 12.  Thank you,  Jess

## 2015-10-08 NOTE — Progress Notes (Signed)
   HPI  Patient presents today here with persistent abdominal pain and would like to MRI results.  Patient states she has persistent midepigastric abdominal pain. Described as sharp non-radiating pains worse with eating also worse with not eating. It appears to be worse with movement. She also has persistent diarrhea, however denies any bloody or black stools. No vomiting.  Tolerating food and fluids as usual. No fevers, chills, sweats.  She also states that her vaginal bleeding has resolved.  PMH: Smoking status noted ROS: Per HPI  Objective: BP 119/79   Pulse 92   Temp 97.3 F (36.3 C) (Oral)   Ht 5\' 5"  (1.651 m)   Wt 173 lb 3.2 oz (78.6 kg)   LMP 09/07/2015 (Exact Date)   BMI 28.82 kg/m  Gen: NAD, alert, cooperative with exam HEENT: NCAT CV: RRR, good S1/S2, no murmur Resp: CTABL, no wheezes, non-labored Abd: Positive bowel sounds, tenderness to palpation in midepigastric area and right upper quadrant, also mild tenderness to palpation throughout, soft, no guarding Ext: No edema, warm Neuro: Alert and oriented, No gross deficits  Assessment and plan:  # Donald pain Out more localized to epigastric area, also with tenderness throughout on my abdominal exam, however the abdominal exam overall is reassuring. She's tolerate food and fluids normally. I have continued to titrate her pain medications down She has her colonoscopy scheduled for October 14, this should provide more details to see if she actually has a underlying malignancy. If she does not I will continue to titrate him down and try to stop them soon as possible. Repeating labs, with epigastric pain consider pancreatitis, however previous lipase was normal, and the patient denies any alcohol use Reasons to seek emergency medical care reviewed  Previous dose was 7.5 mg twice daily, she has used all of these pills, cutting back to 5 mg twice daily  Cut back to one half tablet twice daily for 1 week, then once  daily for 1 week, then off if there is no reason for abdominal pain identified.   Meds ordered this encounter  Medications  . oxyCODONE-acetaminophen (ROXICET) 5-325 MG tablet    Sig: Take 1 tablet by mouth every 12 (twelve) hours as needed for severe pain.    Dispense:  28 tablet    Refill:  0    Laroy Apple, MD [facility] 10/08/2015, 3:44 PM

## 2015-10-09 LAB — CBC WITH DIFFERENTIAL/PLATELET
Basophils Absolute: 0.1 10*3/uL (ref 0.0–0.2)
Basos: 1 %
EOS (ABSOLUTE): 0.2 10*3/uL (ref 0.0–0.4)
Eos: 3 %
Hematocrit: 42.6 % (ref 34.0–46.6)
Hemoglobin: 15.2 g/dL (ref 11.1–15.9)
IMMATURE GRANULOCYTES: 0 %
Immature Grans (Abs): 0 10*3/uL (ref 0.0–0.1)
Lymphocytes Absolute: 1.9 10*3/uL (ref 0.7–3.1)
Lymphs: 25 %
MCH: 31.6 pg (ref 26.6–33.0)
MCHC: 35.7 g/dL (ref 31.5–35.7)
MCV: 89 fL (ref 79–97)
MONOS ABS: 0.4 10*3/uL (ref 0.1–0.9)
Monocytes: 5 %
NEUTROS PCT: 66 %
Neutrophils Absolute: 5.3 10*3/uL (ref 1.4–7.0)
PLATELETS: 273 10*3/uL (ref 150–379)
RBC: 4.81 x10E6/uL (ref 3.77–5.28)
RDW: 13.8 % (ref 12.3–15.4)
WBC: 7.9 10*3/uL (ref 3.4–10.8)

## 2015-10-09 LAB — CMP14+EGFR
A/G RATIO: 1.7 (ref 1.2–2.2)
ALK PHOS: 71 IU/L (ref 39–117)
ALT: 10 IU/L (ref 0–32)
AST: 16 IU/L (ref 0–40)
Albumin: 4.5 g/dL (ref 3.5–5.5)
BILIRUBIN TOTAL: 0.6 mg/dL (ref 0.0–1.2)
BUN/Creatinine Ratio: 13 (ref 9–23)
BUN: 11 mg/dL (ref 6–24)
CHLORIDE: 103 mmol/L (ref 96–106)
CO2: 22 mmol/L (ref 18–29)
CREATININE: 0.83 mg/dL (ref 0.57–1.00)
Calcium: 9.6 mg/dL (ref 8.7–10.2)
GFR calc Af Amer: 96 mL/min/{1.73_m2} (ref >59)
GFR calc non Af Amer: 84 mL/min/{1.73_m2} (ref >59)
GLOBULIN, TOTAL: 2.6 g/dL (ref 1.5–4.5)
Glucose: 89 mg/dL (ref 65–99)
POTASSIUM: 4 mmol/L (ref 3.5–5.2)
SODIUM: 140 mmol/L (ref 134–144)
Total Protein: 7.1 g/dL (ref 6.0–8.5)

## 2015-10-09 LAB — LIPASE: Lipase: 37 U/L (ref 14–72)

## 2015-10-09 NOTE — Progress Notes (Signed)
Patient aware.

## 2015-10-09 NOTE — Telephone Encounter (Signed)
The pt has been notified and will keep colon as scheduled.

## 2015-10-11 NOTE — Telephone Encounter (Signed)
Patient seen 10/2.

## 2015-10-12 ENCOUNTER — Telehealth: Payer: Self-pay

## 2015-10-12 NOTE — Telephone Encounter (Signed)
Spoke with patient and told her I would put a Suprep sample up front for her to pick up.  Patient agreed.

## 2015-10-17 ENCOUNTER — Telehealth: Payer: Self-pay | Admitting: Internal Medicine

## 2015-10-17 NOTE — Telephone Encounter (Signed)
No, given that she rescheduled

## 2015-10-18 ENCOUNTER — Encounter: Payer: Self-pay | Admitting: Internal Medicine

## 2015-10-22 ENCOUNTER — Other Ambulatory Visit: Payer: Self-pay | Admitting: Family Medicine

## 2015-10-22 NOTE — Telephone Encounter (Signed)
Patient aware.

## 2015-10-22 NOTE — Telephone Encounter (Signed)
Needs appointment  Laroy Apple, MD [facility] 10/22/2015, 12:24 PM

## 2015-10-22 NOTE — Telephone Encounter (Signed)
Route response to pool A 

## 2015-10-23 ENCOUNTER — Encounter: Payer: Self-pay | Admitting: Obstetrics & Gynecology

## 2015-10-26 ENCOUNTER — Ambulatory Visit (INDEPENDENT_AMBULATORY_CARE_PROVIDER_SITE_OTHER): Payer: Self-pay | Admitting: Family Medicine

## 2015-10-26 ENCOUNTER — Encounter: Payer: Self-pay | Admitting: Family Medicine

## 2015-10-26 VITALS — BP 132/82 | HR 75 | Temp 97.3°F | Ht 65.0 in | Wt 173.2 lb

## 2015-10-26 DIAGNOSIS — R1032 Left lower quadrant pain: Secondary | ICD-10-CM

## 2015-10-26 DIAGNOSIS — F411 Generalized anxiety disorder: Secondary | ICD-10-CM

## 2015-10-26 MED ORDER — CITALOPRAM HYDROBROMIDE 20 MG PO TABS: ORAL_TABLET | ORAL | 5 refills | Status: DC

## 2015-10-26 MED ORDER — CEPHALEXIN 500 MG PO CAPS: 500.0000 mg | ORAL_CAPSULE | Freq: Three times a day (TID) | ORAL | 0 refills | Status: DC

## 2015-10-26 NOTE — Progress Notes (Signed)
   HPI  Patient presents today follow-up of abdominal Pain.  Patient has an interesting history. She first established care with mefor abdominal pain in he was then hospitalized for stroke-like symptoms after a visit for chest pain. MRI did not how any evidence of stroke, neurology saw her in June confirming the same. They had low suspicion for cervical etiology as well.   She was then seen in the ED for migraine, then again for Abd pain.  CT scan showed thickened area of bowel concerning for neoplasia, she did not clean out well for a c scope.   F/u MRI for liver lesion and Colon ca work up did not show any bowel lesions and showed a small hemangioma.   She revieved narcotics for abd pain the last 6-8 weeks now.   She has had for me, epigastric abd pain ( may), then generalized (9/9),  then mid-epigastric (10/2), now LLQ abd pain with radiation to midline and upward to epigastric area.  No associated symps. Is having nausea and occasional vomiting with eating. Tolerating food well overall.  Only oxycodone helps  Sharp pain now  She has f/u scheduled with GYN for vaginal bleeding.    Now c/o dysuria X 1 day, chills for 1 week,urinary frequency for 2 weeks   PMH: Smoking status noted ROS: Per HPI  Objective: BP 132/82   Pulse 75   Temp 97.3 F (36.3 C) (Oral)   Ht 5\' 5"  (1.651 m)   Wt 173 lb 3.2 oz (78.6 kg)   BMI 28.82 kg/m  Gen: NAD, alert, cooperative with exam HEENT: NCAT, EOMI, PERRL CV: RRR, good S1/S2, no murmur Resp: CTABL, no wheezes, non-labored Abd: soft, no guarding, + suprapubic tnednerness, + BS Ext: No edema, warm Neuro: Alert and oriented, No gross deficits  Assessment and plan:  # Abd pain Unclear etiology I think Colon cancer is unlikely given vague findings on CT scan and no findings on MRI, I am eager to see C scope findings Declined request for narcotics today Recommended f/u GI, GYN, pepcid and mylanta Low threshold for return  #  Dysuria Empirically treating for UTI with keflex, she is now self pay  GAD Stable Change to celexa for cost, also pt states lexapro not helping NO SI  F/u 3-4 weeks    Meds ordered this encounter  Medications  . cephALEXin (KEFLEX) 500 MG capsule    Sig: Take 1 capsule (500 mg total) by mouth 3 (three) times daily.    Dispense:  21 capsule    Refill:  0  . citalopram (CELEXA) 20 MG tablet    Sig: 1/2 tablet for 2 weeks then 1 tablet daily    Dispense:  30 tablet    Refill:  [place], MD [facility] 10/26/2015, 1:17 PM

## 2015-10-26 NOTE — Patient Instructions (Signed)
Great to see you!  I have changed your anxiety medicine to citalopram, take 1/2 pill daily for 2 weeks then in crease to 1 pill daily.   Please be sure to see Dr. Hilarie Fredrickson for the colonoscopy  Please keep your appointment with GYN.   Try pepcid 1 pill twice daily for the abdominal pain.

## 2015-11-05 ENCOUNTER — Encounter: Payer: Self-pay | Admitting: Internal Medicine

## 2015-11-06 ENCOUNTER — Encounter: Payer: Self-pay | Admitting: Obstetrics & Gynecology

## 2015-11-13 ENCOUNTER — Ambulatory Visit (AMBULATORY_SURGERY_CENTER): Payer: Self-pay | Admitting: Internal Medicine

## 2015-11-13 ENCOUNTER — Encounter: Payer: Self-pay | Admitting: Internal Medicine

## 2015-11-13 VITALS — BP 100/65 | HR 73 | Temp 98.4°F | Resp 12 | Ht 65.0 in | Wt 173.0 lb

## 2015-11-13 DIAGNOSIS — Z86010 Personal history of colonic polyps: Secondary | ICD-10-CM

## 2015-11-13 DIAGNOSIS — R933 Abnormal findings on diagnostic imaging of other parts of digestive tract: Secondary | ICD-10-CM

## 2015-11-13 DIAGNOSIS — Z800 Family history of malignant neoplasm of digestive organs: Secondary | ICD-10-CM

## 2015-11-13 MED ORDER — SODIUM CHLORIDE 0.9 % IV SOLN: 500.0000 mL | INTRAVENOUS | Status: DC

## 2015-11-13 MED ORDER — SUCRALFATE 1 GM/10ML PO SUSP: 1.0000 g | Freq: Three times a day (TID) | ORAL | 1 refills | Status: DC

## 2015-11-13 NOTE — Progress Notes (Signed)
PT. Noted with small open sores along right upper and lower arm upon admission,when questioned about areas she stated it my nerves asked her if she is scratching self and she stated yes.

## 2015-11-13 NOTE — Op Note (Addendum)
[facility] Patient Name: Heather [name] Procedure Date: 11/13/2015 4:40 PM MRN: [id number] Endoscopist: Jerene Bears , MD Age: 50 Referring MD:  Date of Birth: 03/30/1965 Gender: Female Account #: [id number] Procedure:                Colonoscopy Indications:              Family history of colon cancer, Personal history of                            colonic polyps (hyperplastic, but inadequate prep                            in April 2017), Abnormal CT of the GI tract showing                            thickening at splenic flexure Medicines:                Monitored Anesthesia Care Procedure:                Pre-Anesthesia Assessment:                           - Prior to the procedure, a History and Physical                            was performed, and patient medications and                            allergies were reviewed. The patient's tolerance of                            previous anesthesia was also reviewed. The risks                            and benefits of the procedure and the sedation                            options and risks were discussed with the patient.                            All questions were answered, and informed consent                            was obtained. Prior Anticoagulants: The patient has                            taken no previous anticoagulant or antiplatelet                            agents. ASA Grade Assessment: III - A patient with                            severe systemic disease. After reviewing the risks  and benefits, the patient was deemed in                            satisfactory condition to undergo the procedure.                           After obtaining informed consent, the colonoscope                            was passed under direct vision. Throughout the                            procedure, the patient's blood pressure, pulse, and                            oxygen saturations were  monitored continuously. The                            Model PCF-H190DL [id number]) scope was introduced                            through the anus and advanced to the the terminal                            ileum. The colonoscopy was performed without                            difficulty. The patient tolerated the procedure                            well. The quality of the bowel preparation was                            fair. The terminal ileum, ileocecal valve,                            appendiceal orifice, and rectum were photographed.                            The bowel preparation used was 2 day SUPREP. Scope In: 4:58:39 PM Scope Out: 5:18:03 PM Scope Withdrawal Time: 0 hours 16 minutes 30 seconds  Total Procedure Duration: 0 hours 19 minutes 24 seconds  Findings:                 The digital rectal exam was normal.                           The terminal ileum appeared normal.                           A moderate amount of semi-liquid stool was found in                            the transverse colon, in the ascending colon and in  the cecum. Lavage of the area was performed using                            copious amounts of tap water, resulting in                            clearance with adequate visualization.                           The colon (entire examined portion) appeared                            normal. No polyp or lesion seen at splenic flexure                            as suggested by CT scan.                           The retroflexed view of the distal rectum and anal                            verge was normal and showed no anal or rectal                            abnormalities. Complications:            No immediate complications. Estimated Blood Loss:     Estimated blood loss: none. Impression:               - Preparation of the colon was fair clearing to                            adequate with copious irrigation and lavage.                            - The examined portion of the ileum was normal.                           - The entire examined colon appeared normal.                           - The distal rectum and anal verge are normal on                            retroflexion view.                           - No specimens collected. Recommendation:           - Patient has a contact number available for                            emergencies. The signs and symptoms of potential  delayed complications were discussed with the                            patient. Return to normal activities tomorrow.                            Written discharge instructions were provided to the                            patient.                           - Resume previous diet.                           - Continue present medications.                           - Repeat colonoscopy in 3 years for screening                            purposes (2 day large volume prep).                           - Discussion with patient after procedure. She                            reports post-prandial abd pain. No help with BID                            pantoprazole, so no longer using. Had H. pylori                            negative gastritis at EGD in Feb 2017. Will try                            Carafate 1 g TID-AC and HS. If not better consider                            HIDA scan to evaluate for biliary dyskinesia. Jerene Bears, MD 11/13/2015 5:28:06 PM This report has been signed electronically.

## 2015-11-13 NOTE — Patient Instructions (Signed)
YOU HAD AN ENDOSCOPIC PROCEDURE TODAY AT [facility]:   Refer to the procedure report that was given to you for any specific questions about what was found during the examination.  If the procedure report does not answer your questions, please call your gastroenterologist to clarify.  If you requested that your care partner not be given the details of your procedure findings, then the procedure report has been included in a sealed envelope for you to review at your convenience later.  YOU SHOULD EXPECT: Some feelings of bloating in the abdomen. Passage of more gas than usual.  Walking can help get rid of the air that was put into your GI tract during the procedure and reduce the bloating. If you had a lower endoscopy (such as a colonoscopy or flexible sigmoidoscopy) you may notice spotting of blood in your stool or on the toilet paper. If you underwent a bowel prep for your procedure, you may not have a normal bowel movement for a few days.  Please Note:  You might notice some irritation and congestion in your nose or some drainage.  This is from the oxygen used during your procedure.  There is no need for concern and it should clear up in a day or so.  SYMPTOMS TO REPORT IMMEDIATELY:   Following lower endoscopy (colonoscopy or flexible sigmoidoscopy):  Excessive amounts of blood in the stool  Significant tenderness or worsening of abdominal pains  Swelling of the abdomen that is new, acute  Fever of 100F or higher    For urgent or emergent issues, a gastroenterologist can be reached at any hour by calling [phone].   DIET:  We do recommend a small meal at first, but then you may proceed to your regular diet.  Drink plenty of fluids but you should avoid alcoholic beverages for 24 hours.  ACTIVITY:  You should plan to take it easy for the rest of today and you should NOT DRIVE or use heavy machinery until tomorrow (because of the sedation medicines used during the test).     FOLLOW UP: Our staff will call the number listed on your records the next business day following your procedure to check on you and address any questions or concerns that you may have regarding the information given to you following your procedure. If we do not reach you, we will leave a message.  However, if you are feeling well and you are not experiencing any problems, there is no need to return our call.  We will assume that you have returned to your regular daily activities without incident.  If any biopsies were taken you will be contacted by phone or by letter within the next 1-3 weeks.  Please call us at [phone] if you have not heard about the biopsies in 3 weeks.    SIGNATURES/CONFIDENTIALITY: You and/or your care partner have signed paperwork which will be entered into your electronic medical record.  These signatures attest to the fact that that the information above on your After Visit Summary has been reviewed and is understood.  Full responsibility of the confidentiality of this discharge information lies with you and/or your care-partner.   RESUME PREVIOUS DIET AND MEDICATIONS   REPEAT COLONOSCOPY IN 3 YEARS ( 2 DAY PREP)

## 2015-11-13 NOTE — Progress Notes (Signed)
A/ox3 pleased with MAC, report to Penny RN 

## 2015-11-14 ENCOUNTER — Telehealth: Payer: Self-pay

## 2015-11-14 DIAGNOSIS — R1013 Epigastric pain: Secondary | ICD-10-CM

## 2015-11-14 NOTE — Telephone Encounter (Signed)
   Dr. Hilarie Fredrickson,  The Carafate that you prescribed for the patient cost 192.00 and the patient does not have insurance so she can not afford to buy the medication. Please advise. Thanks   Izora Gala

## 2015-11-14 NOTE — Telephone Encounter (Signed)
  Follow up Call-  Call back number 11/13/2015 04/10/2015 03/01/2015  Post procedure Call Back phone  # [phone]  Permission to leave phone message Yes Yes Yes  Some recent data might be hidden    Patient was called for follow up after her procedure on 11/13/2015. No answer at the number given for follow up phone call. A message was left on the answering machine.

## 2015-11-14 NOTE — Telephone Encounter (Signed)
  Follow up Call-  Call back number 11/13/2015 04/10/2015 03/01/2015  Post procedure Call Back phone  # [phone]  Permission to leave phone message Yes Yes Yes  Some recent data might be hidden     Patient questions:  Do you have a fever, pain , or abdominal swelling? No. Pain Score  0 *  Have you tolerated food without any problems? Yes.    Have you been able to return to your normal activities? Yes.    Do you have any questions about your discharge instructions: Diet   No. Medications  No. Follow up visit  No.  Do you have questions or concerns about your Care? No.  Actions: * If pain score is 4 or above: No action needed, pain <4.

## 2015-11-15 MED ORDER — SUCRALFATE 1 G PO TABS: 1.0000 g | ORAL_TABLET | Freq: Three times a day (TID) | ORAL | 1 refills | Status: DC

## 2015-11-15 NOTE — Telephone Encounter (Signed)
  Dr. Hilarie Fredrickson recommended that we change Carafate suspension to Tablets so that it would be more affordable to the patient. A new prescription was ordered for the patient. I spoke with Heather [name] and she agreed to try the tablets if she can afford them.

## 2015-11-15 NOTE — Telephone Encounter (Signed)
Change to tablet TID-AC and HS and this will dramatically lower cost

## 2015-11-19 ENCOUNTER — Encounter: Payer: Self-pay | Admitting: Obstetrics & Gynecology

## 2015-11-26 ENCOUNTER — Encounter: Payer: Self-pay | Admitting: Obstetrics and Gynecology

## 2015-11-28 ENCOUNTER — Ambulatory Visit: Payer: Self-pay | Admitting: Family Medicine

## 2015-12-04 ENCOUNTER — Ambulatory Visit: Payer: Self-pay | Admitting: Family Medicine

## 2015-12-05 ENCOUNTER — Encounter: Payer: Self-pay | Admitting: Family Medicine

## 2015-12-10 ENCOUNTER — Encounter: Payer: Self-pay | Admitting: *Deleted

## 2015-12-10 ENCOUNTER — Encounter: Payer: Self-pay | Admitting: Obstetrics & Gynecology

## 2015-12-12 ENCOUNTER — Ambulatory Visit: Payer: Self-pay | Admitting: Family Medicine

## 2015-12-13 ENCOUNTER — Encounter: Payer: Self-pay | Admitting: Family Medicine

## 2015-12-13 ENCOUNTER — Telehealth: Payer: Self-pay | Admitting: Family Medicine

## 2015-12-13 NOTE — Telephone Encounter (Signed)
Patient states that she forgot about appointment and will call back to re schedule.

## 2015-12-17 ENCOUNTER — Telehealth: Payer: Self-pay | Admitting: Family Medicine

## 2015-12-19 NOTE — Telephone Encounter (Signed)
Has appt 12/20/15

## 2015-12-20 ENCOUNTER — Telehealth: Payer: Self-pay | Admitting: Family Medicine

## 2015-12-20 ENCOUNTER — Encounter: Payer: Self-pay | Admitting: Family Medicine

## 2015-12-20 ENCOUNTER — Ambulatory Visit (INDEPENDENT_AMBULATORY_CARE_PROVIDER_SITE_OTHER): Payer: Self-pay | Admitting: Family Medicine

## 2015-12-20 VITALS — BP 128/81 | HR 86 | Temp 98.0°F | Ht 65.0 in | Wt 172.4 lb

## 2015-12-20 DIAGNOSIS — F39 Unspecified mood [affective] disorder: Secondary | ICD-10-CM

## 2015-12-20 DIAGNOSIS — M79604 Pain in right leg: Secondary | ICD-10-CM

## 2015-12-20 DIAGNOSIS — R739 Hyperglycemia, unspecified: Secondary | ICD-10-CM

## 2015-12-20 DIAGNOSIS — G629 Polyneuropathy, unspecified: Secondary | ICD-10-CM

## 2015-12-20 LAB — BAYER DCA HB A1C WAIVED: HB A1C: 4.7 % (ref <7.0)

## 2015-12-20 MED ORDER — PREDNISONE 20 MG PO TABS
40.0000 mg | ORAL_TABLET | Freq: Every day | ORAL | 0 refills | Status: DC
Start: 2015-12-20 — End: 2016-05-12

## 2015-12-20 MED ORDER — CITALOPRAM HYDROBROMIDE 40 MG PO TABS: 40.0000 mg | ORAL_TABLET | Freq: Every day | ORAL | 5 refills | Status: DC

## 2015-12-20 MED ORDER — GABAPENTIN 400 MG PO CAPS: 400.0000 mg | ORAL_CAPSULE | Freq: Three times a day (TID) | ORAL | 2 refills | Status: DC

## 2015-12-20 NOTE — Patient Instructions (Signed)
Heather [name] tto see you!  I have increased celexa to 40 mg once daily  I have increased gabapentin to 400 mg, you can take this 3 times daily. It may cause sleepiness  I have given you a short course of steroids to see if this can help your leg symptoms  Please call neurology for an appointment to discuss you leg symptoms

## 2015-12-20 NOTE — Telephone Encounter (Signed)
I cant determine

## 2015-12-20 NOTE — Progress Notes (Addendum)
HPI  Patient presents today for evaluation of right leg pain, hyperglycemia, and depression.  Depression Doing much better on citalopram, denies any suicidal thoughts, states that she has not had any additional suicidal thoughts since starting medication No side effects  Right leg pain Patient explains that she has anterior right leg pain for the last 5-6 days, and starts on her proximal right anterior thigh and radiates down to her right foot, she describes dull achy pain and complete numbness. She is walking normally most of the time and has a few episodes of her leg "giving away". She denies any back pain. She has not tried any medications, she is using a cane and doing well with this.  Hyperglycemia Patient checked her blood sugar with her mothers glucose monitor, it was 260 a few days ago. She felt slightly dizzy of the time. No abdominal pain, admits to drinking sugar sweetened beverages.  No history of diabetes.   PMH: Smoking status noted ROS: Per HPI  Objective: BP 128/81   Pulse 86   Temp 98 F (36.7 C) (Oral)   Ht 5' 5"  (1.651 m)   Wt 172 lb 6.4 oz (78.2 kg)   BMI 28.69 kg/m  Gen: NAD, alert, cooperative with exam HEENT: NCAT CV: RRR, good S1/S2, no murmur Resp: CTABL, no wheezes, non-labored Ext: No edema, warm Neuro: Alert and oriented, strength 5/5 in bilateral lower extremities, 2+ patellar tendon reflexes bilaterally, patient reports complete numbness from the anterior thigh to the right foot, normal sensation in left leg bilateral hands Normal grip, 5/5 strength in bilateral grip Cranial nerves II through XII intact Walking with a limp using a cane on the right side.  No pain to palp of the leg, no pain to palp of BL lumbar paraspinal muscles or lumbar spine.   Assessment and plan:  # Leg pain, neuropathy No clear nerve involvement or radicular symptoms involving the back, however her "decreased strength and weakness" cannot be validated on the  physical exam She does have reported decreased sensation She has very reassuring reflexes Short course of steroids, increase gabapentin to 400 mg 3 times daily Recommended calling Neurologist for appt   # Mood disorder Mixed anxiety and depression Improvement with Celexa, titrating to 40 mg Increase in gabapentin which will likely help with anxious feelings  # Hyperglycemia Reported by patient, verified with labs today, A1c included Discussed that she is diabetic, prednisone will harm sugar control  Pt requesting letter of support for disability, has been denied X 1, written letter stating she is under my care, did not comment on status of disability   Orders Placed This Encounter  Procedures  . CMP14+EGFR  . CBC with Differential/Platelet  . Bayer DCA Hb A1c Waived    Meds ordered this encounter  Medications  . gabapentin (NEURONTIN) 400 MG capsule    Sig: Take 1 capsule (400 mg total) by mouth 3 (three) times daily.    Dispense:  90 capsule    Refill:  2  . predniSONE (DELTASONE) 20 MG tablet    Sig: Take 2 tablets (40 mg total) by mouth daily with breakfast.    Dispense:  10 tablet    Refill:  0  . citalopram (CELEXA) 40 MG tablet    Sig: Take 1 tablet (40 mg total) by mouth daily.    Dispense:  30 tablet    Refill:  Harrison, MD [facility] 12/20/2015, 1:29 PM

## 2015-12-20 NOTE — Telephone Encounter (Signed)
Advised pt we don't have her results back yet and we will call her once we receive results.

## 2015-12-21 LAB — CMP14+EGFR
A/G RATIO: 2 (ref 1.2–2.2)
ALT: 21 IU/L (ref 0–32)
AST: 19 IU/L (ref 0–40)
Albumin: 4.7 g/dL (ref 3.5–5.5)
Alkaline Phosphatase: 77 IU/L (ref 39–117)
BUN/Creatinine Ratio: 11 (ref 9–23)
BUN: 8 mg/dL (ref 6–24)
Bilirubin Total: 0.4 mg/dL (ref 0.0–1.2)
CALCIUM: 9.5 mg/dL (ref 8.7–10.2)
CO2: 22 mmol/L (ref 18–29)
CREATININE: 0.75 mg/dL (ref 0.57–1.00)
Chloride: 100 mmol/L (ref 96–106)
GFR calc Af Amer: 109 mL/min/{1.73_m2} (ref >59)
GFR, EST NON AFRICAN AMERICAN: 95 mL/min/{1.73_m2} (ref >59)
GLUCOSE: 90 mg/dL (ref 65–99)
Globulin, Total: 2.3 g/dL (ref 1.5–4.5)
POTASSIUM: 4.1 mmol/L (ref 3.5–5.2)
Sodium: 140 mmol/L (ref 134–144)
Total Protein: 7 g/dL (ref 6.0–8.5)

## 2015-12-21 LAB — CBC WITH DIFFERENTIAL/PLATELET
BASOS: 1 %
Basophils Absolute: 0 10*3/uL (ref 0.0–0.2)
EOS (ABSOLUTE): 0.2 10*3/uL (ref 0.0–0.4)
Eos: 3 %
Hematocrit: 43.8 % (ref 34.0–46.6)
Hemoglobin: 15.2 g/dL (ref 11.1–15.9)
IMMATURE GRANS (ABS): 0 10*3/uL (ref 0.0–0.1)
IMMATURE GRANULOCYTES: 0 %
LYMPHS: 23 %
Lymphocytes Absolute: 1.7 10*3/uL (ref 0.7–3.1)
MCH: 31.3 pg (ref 26.6–33.0)
MCHC: 34.7 g/dL (ref 31.5–35.7)
MCV: 90 fL (ref 79–97)
MONOCYTES: 5 %
Monocytes Absolute: 0.4 10*3/uL (ref 0.1–0.9)
Neutrophils Absolute: 5 10*3/uL (ref 1.4–7.0)
Neutrophils: 68 %
PLATELETS: 339 10*3/uL (ref 150–379)
RBC: 4.86 x10E6/uL (ref 3.77–5.28)
RDW: 13.4 % (ref 12.3–15.4)
WBC: 7.4 10*3/uL (ref 3.4–10.8)

## 2015-12-21 NOTE — Telephone Encounter (Signed)
Aware, per message left on voice mail,    must go to social services to determine disability.

## 2015-12-21 NOTE — Telephone Encounter (Signed)
I cannot support her claim for disability. Pt will have to apply through social services and consider disability physicaicn evaluation.   Laroy Apple, MD [facility] 12/21/2015, 8:04 AM

## 2016-04-21 ENCOUNTER — Encounter: Payer: Self-pay | Admitting: Obstetrics & Gynecology

## 2016-04-29 ENCOUNTER — Encounter: Payer: Self-pay | Admitting: Obstetrics & Gynecology

## 2016-04-29 ENCOUNTER — Encounter: Payer: Self-pay | Admitting: *Deleted

## 2016-05-06 DIAGNOSIS — Z9289 Personal history of other medical treatment: Secondary | ICD-10-CM

## 2016-05-06 HISTORY — DX: Personal history of other medical treatment: Z92.89

## 2016-05-12 ENCOUNTER — Observation Stay (HOSPITAL_COMMUNITY)
Admission: EM | Admit: 2016-05-12 | Discharge: 2016-05-13 | Disposition: A | Discharge status: Home / Self Care | Payer: Self-pay | Attending: Family Medicine | Admitting: Family Medicine

## 2016-05-12 ENCOUNTER — Encounter (HOSPITAL_COMMUNITY): Payer: Self-pay

## 2016-05-12 ENCOUNTER — Emergency Department (HOSPITAL_COMMUNITY): Payer: Self-pay

## 2016-05-12 DIAGNOSIS — Z8249 Family history of ischemic heart disease and other diseases of the circulatory system: Secondary | ICD-10-CM | POA: Insufficient documentation

## 2016-05-12 DIAGNOSIS — I4581 Long QT syndrome: Secondary | ICD-10-CM | POA: Insufficient documentation

## 2016-05-12 DIAGNOSIS — N83201 Unspecified ovarian cyst, right side: Secondary | ICD-10-CM | POA: Insufficient documentation

## 2016-05-12 DIAGNOSIS — F17210 Nicotine dependence, cigarettes, uncomplicated: Secondary | ICD-10-CM | POA: Insufficient documentation

## 2016-05-12 DIAGNOSIS — Z79899 Other long term (current) drug therapy: Secondary | ICD-10-CM | POA: Insufficient documentation

## 2016-05-12 DIAGNOSIS — Z86010 Personal history of colonic polyps: Secondary | ICD-10-CM | POA: Insufficient documentation

## 2016-05-12 DIAGNOSIS — R0789 Other chest pain: Secondary | ICD-10-CM

## 2016-05-12 DIAGNOSIS — F329 Major depressive disorder, single episode, unspecified: Secondary | ICD-10-CM | POA: Insufficient documentation

## 2016-05-12 DIAGNOSIS — Z8673 Personal history of transient ischemic attack (TIA), and cerebral infarction without residual deficits: Secondary | ICD-10-CM | POA: Insufficient documentation

## 2016-05-12 DIAGNOSIS — R51 Headache: Secondary | ICD-10-CM | POA: Insufficient documentation

## 2016-05-12 DIAGNOSIS — F17200 Nicotine dependence, unspecified, uncomplicated: Secondary | ICD-10-CM | POA: Diagnosis present

## 2016-05-12 DIAGNOSIS — E559 Vitamin D deficiency, unspecified: Secondary | ICD-10-CM | POA: Insufficient documentation

## 2016-05-12 DIAGNOSIS — Z800 Family history of malignant neoplasm of digestive organs: Secondary | ICD-10-CM | POA: Insufficient documentation

## 2016-05-12 DIAGNOSIS — Z885 Allergy status to narcotic agent status: Secondary | ICD-10-CM | POA: Insufficient documentation

## 2016-05-12 DIAGNOSIS — Z818 Family history of other mental and behavioral disorders: Secondary | ICD-10-CM | POA: Insufficient documentation

## 2016-05-12 DIAGNOSIS — F411 Generalized anxiety disorder: Secondary | ICD-10-CM | POA: Insufficient documentation

## 2016-05-12 DIAGNOSIS — R079 Chest pain, unspecified: Principal | ICD-10-CM | POA: Diagnosis present

## 2016-05-12 DIAGNOSIS — Z8052 Family history of malignant neoplasm of bladder: Secondary | ICD-10-CM | POA: Insufficient documentation

## 2016-05-12 DIAGNOSIS — J449 Chronic obstructive pulmonary disease, unspecified: Secondary | ICD-10-CM | POA: Insufficient documentation

## 2016-05-12 DIAGNOSIS — Z09 Encounter for follow-up examination after completed treatment for conditions other than malignant neoplasm: Secondary | ICD-10-CM | POA: Insufficient documentation

## 2016-05-12 DIAGNOSIS — Z886 Allergy status to analgesic agent status: Secondary | ICD-10-CM | POA: Insufficient documentation

## 2016-05-12 DIAGNOSIS — K219 Gastro-esophageal reflux disease without esophagitis: Secondary | ICD-10-CM | POA: Insufficient documentation

## 2016-05-12 DIAGNOSIS — Z7982 Long term (current) use of aspirin: Secondary | ICD-10-CM | POA: Insufficient documentation

## 2016-05-12 DIAGNOSIS — R197 Diarrhea, unspecified: Secondary | ICD-10-CM | POA: Insufficient documentation

## 2016-05-12 LAB — COMPREHENSIVE METABOLIC PANEL
ALBUMIN: 3.7 g/dL (ref 3.5–5.0)
ALK PHOS: 70 U/L (ref 38–126)
ALT: 14 U/L (ref 14–54)
ANION GAP: 7 (ref 5–15)
AST: 16 U/L (ref 15–41)
BUN: 9 mg/dL (ref 6–20)
CALCIUM: 8.9 mg/dL (ref 8.9–10.3)
CHLORIDE: 108 mmol/L (ref 101–111)
CO2: 22 mmol/L (ref 22–32)
CREATININE: 0.65 mg/dL (ref 0.44–1.00)
GFR calc non Af Amer: >60 mL/min (ref >60)
GLUCOSE: 89 mg/dL (ref 65–99)
Potassium: 3.7 mmol/L (ref 3.5–5.1)
Sodium: 137 mmol/L (ref 135–145)
Total Bilirubin: 0.5 mg/dL (ref 0.3–1.2)
Total Protein: 6.5 g/dL (ref 6.5–8.1)

## 2016-05-12 LAB — RAPID URINE DRUG SCREEN, HOSP PERFORMED
AMPHETAMINES: NOT DETECTED
BENZODIAZEPINES: NOT DETECTED
Barbiturates: NOT DETECTED
COCAINE: NOT DETECTED
OPIATES: POSITIVE — AB
TETRAHYDROCANNABINOL: NOT DETECTED

## 2016-05-12 LAB — CBC WITH DIFFERENTIAL/PLATELET
BASOS PCT: 0 %
Basophils Absolute: 0 10*3/uL (ref 0.0–0.1)
EOS PCT: 3 %
Eosinophils Absolute: 0.3 10*3/uL (ref 0.0–0.7)
HCT: 40.4 % (ref 36.0–46.0)
HEMOGLOBIN: 14.4 g/dL (ref 12.0–15.0)
Lymphocytes Relative: 21 %
Lymphs Abs: 2 10*3/uL (ref 0.7–4.0)
MCH: 31.2 pg (ref 26.0–34.0)
MCHC: 35.6 g/dL (ref 30.0–36.0)
MCV: 87.6 fL (ref 78.0–100.0)
Monocytes Absolute: 0.5 10*3/uL (ref 0.1–1.0)
Monocytes Relative: 6 %
NEUTROS PCT: 70 %
Neutro Abs: 6.7 10*3/uL (ref 1.7–7.7)
PLATELETS: 267 10*3/uL (ref 150–400)
RBC: 4.61 MIL/uL (ref 3.87–5.11)
RDW: 13.4 % (ref 11.5–15.5)
WBC: 9.6 10*3/uL (ref 4.0–10.5)

## 2016-05-12 LAB — I-STAT TROPONIN, ED: Troponin i, poc: 0 ng/mL (ref 0.00–0.08)

## 2016-05-12 LAB — TSH: TSH: 0.8 u[IU]/mL (ref 0.350–4.500)

## 2016-05-12 LAB — TROPONIN I

## 2016-05-12 MED ORDER — ONDANSETRON HCL 4 MG/2ML IJ SOLN
4.0000 mg | Freq: Once | INTRAMUSCULAR | Status: AC
  Administered 2016-05-12: 4 mg via INTRAVENOUS
  Filled 2016-05-12: qty 2

## 2016-05-12 MED ORDER — ACETAMINOPHEN 325 MG PO TABS: 650.0000 mg | ORAL_TABLET | ORAL | Status: DC | PRN

## 2016-05-12 MED ORDER — ONDANSETRON HCL 4 MG/2ML IJ SOLN: 4.0000 mg | Freq: Four times a day (QID) | INTRAMUSCULAR | Status: DC | PRN

## 2016-05-12 MED ORDER — NITROGLYCERIN 0.4 MG SL SUBL
0.4000 mg | SUBLINGUAL_TABLET | SUBLINGUAL | Status: DC | PRN
  Administered 2016-05-12 (×2): 0.4 mg via SUBLINGUAL
  Filled 2016-05-12: qty 1

## 2016-05-12 MED ORDER — MORPHINE SULFATE (PF) 4 MG/ML IV SOLN
4.0000 mg | Freq: Once | INTRAVENOUS | Status: AC
  Administered 2016-05-12: 4 mg via INTRAVENOUS
  Filled 2016-05-12: qty 1

## 2016-05-12 MED ORDER — NICOTINE 21 MG/24HR TD PT24
21.0000 mg | MEDICATED_PATCH | Freq: Every day | TRANSDERMAL | Status: DC
  Administered 2016-05-12 – 2016-05-13 (×2): 21 mg via TRANSDERMAL
  Filled 2016-05-12 (×2): qty 1

## 2016-05-12 MED ORDER — MORPHINE SULFATE (PF) 2 MG/ML IV SOLN
2.0000 mg | INTRAVENOUS | Status: DC | PRN
  Administered 2016-05-12 – 2016-05-13 (×5): 2 mg via INTRAVENOUS
  Filled 2016-05-12 (×5): qty 1

## 2016-05-12 MED ORDER — SODIUM CHLORIDE 0.9 % IV BOLUS (SEPSIS)
500.0000 mL | Freq: Once | INTRAVENOUS | Status: AC
  Administered 2016-05-12: 500 mL via INTRAVENOUS

## 2016-05-12 MED ORDER — GI COCKTAIL ~~LOC~~: 30.0000 mL | Freq: Four times a day (QID) | ORAL | Status: DC | PRN

## 2016-05-12 MED ORDER — ENOXAPARIN SODIUM 40 MG/0.4ML ~~LOC~~ SOLN
40.0000 mg | SUBCUTANEOUS | Status: DC
  Administered 2016-05-12: 40 mg via SUBCUTANEOUS
  Filled 2016-05-12: qty 0.4

## 2016-05-12 MED ORDER — ASPIRIN 81 MG PO CHEW
81.0000 mg | CHEWABLE_TABLET | Freq: Every day | ORAL | Status: DC
  Administered 2016-05-13: 81 mg via ORAL
  Filled 2016-05-12: qty 1

## 2016-05-12 NOTE — ED Provider Notes (Signed)
[facility] DEPT Provider Note   CSN: [id number] Arrival date & time: 05/12/16  1415     History   Chief Complaint Chief Complaint  Patient presents with  . Chest Pain    HPI Heather [name] is a 51 y.o. female.  Patient states today she started having some chest comfort radiating down her left arm with some numbness in her left arm. Patient has a history of stroke. Is taking aspirin. She does smoke. Family history of coronary artery disease   The history is provided by the patient. No language interpreter was used.  Chest Pain   This is a new problem. The current episode started less than 1 hour ago. The problem occurs rarely. The problem has been gradually improving. The pain is associated with exertion. The pain is present in the substernal region. The pain is at a severity of 5/10. The pain is moderate. The quality of the pain is described as brief. The pain radiates to the left arm. Pertinent negatives include no abdominal pain, no back pain, no cough and no headaches.  Pertinent negatives for past medical history include no seizures.    Past Medical History:  Diagnosis Date  . Anxiety   . Chronic headache   . Depression   . Emphysema of lung ([place])   . Gastritis   . GERD (gastroesophageal reflux disease)   . Polyp of colon 04/2015   benign  . Stroke [facility])    STATED 2 LIGHT STROKES JUNE 21ST 2017  . Tobacco abuse     Patient Active Problem List   Diagnosis Date Noted  . Abnormal vaginal bleeding 09/20/2015  . Cyst of right ovary 09/20/2015  . Right lower quadrant abdominal pain 09/20/2015  . Abdominal pain, left lower quadrant 09/20/2015  . Abnormal CT scan, colon 09/20/2015  . Numbness and tingling of right arm and leg 05/28/2015  . Right sided weakness   . Tobacco use disorder 03/17/2015  . Esophageal reflux 03/16/2015  . Vitamin D deficiency 02/03/2015  . Generalized anxiety disorder 02/03/2015  . Numbness 02/03/2015    Past Surgical History:    Procedure Laterality Date  . COLONOSCOPY    . EXTERNAL EAR SURGERY    . POLYPECTOMY    . SKIN GRAFT      OB History    Gravida Para Term Preterm AB Living             2   SAB TAB Ectopic Multiple Live Births                   Home Medications    Prior to Admission medications   Medication Sig Start Date End Date Taking? Authorizing Provider  aspirin 81 MG chewable tablet Chew 81 mg by mouth daily.   Yes [provider]  citalopram (CELEXA) 40 MG tablet Take 1 tablet (40 mg total) by mouth daily. Patient not taking: Reported on 05/12/2016 12/20/15   Timmothy Euler, MD  gabapentin (NEURONTIN) 400 MG capsule Take 1 capsule (400 mg total) by mouth 3 (three) times daily. Patient not taking: Reported on 05/12/2016 12/20/15   Timmothy Euler, MD    Family History Family History  Problem Relation Age of Onset  . Heart failure Mother   . Colon cancer Mother 71  . Bladder Cancer Mother   . Heart disease Father   . Breast cancer Sister   . Heart disease Maternal Aunt   . Heart disease Maternal Uncle   . Colon  cancer Paternal Aunt 59  . Bipolar disorder Son     Social History Social History  Substance Use Topics  . Smoking status: Current Every Day Smoker    Packs/day: 1.00    Years: 30.00    Types: Cigarettes  . Smokeless tobacco: Never Used     Comment: 02-08-2015 info given  . Alcohol use No     Allergies   Aleve [naproxen]; Asa [aspirin]; Excedrin [aspirin-acetaminophen-caffeine]; and Vicodin [hydrocodone-acetaminophen]   Review of Systems Review of Systems  Constitutional: Negative for appetite change and fatigue.  HENT: Negative for congestion, ear discharge and sinus pressure.   Eyes: Negative for discharge.  Respiratory: Negative for cough.   Cardiovascular: Positive for chest pain.  Gastrointestinal: Negative for abdominal pain and diarrhea.  Genitourinary: Negative for frequency and hematuria.  Musculoskeletal: Negative for back pain.   Skin: Negative for rash.  Neurological: Negative for seizures and headaches.  Psychiatric/Behavioral: Negative for hallucinations.     Physical Exam Updated Vital Signs BP 112/71   Pulse 72   Resp 18   Ht 5\' 5"  (1.651 m)   Wt 160 lb (72.6 kg)   LMP 05/07/2016   SpO2 96%   BMI 26.63 kg/m   Physical Exam  Constitutional: She is oriented to person, place, and time. She appears well-developed.  HENT:  Head: Normocephalic.  Eyes: Conjunctivae and EOM are normal. No scleral icterus.  Neck: Neck supple. No thyromegaly present.  Cardiovascular: Normal rate and regular rhythm.  Exam reveals no gallop and no friction rub.   No murmur heard. Pulmonary/Chest: No stridor. She has no wheezes. She has no rales. She exhibits no tenderness.  Abdominal: She exhibits no distension. There is no tenderness. There is no rebound.  Musculoskeletal: Normal range of motion. She exhibits no edema.  Lymphadenopathy:    She has no cervical adenopathy.  Neurological: She is oriented to person, place, and time. She exhibits normal muscle tone. Coordination normal.  Skin: No rash noted. No erythema.  Psychiatric: She has a normal mood and affect. Her behavior is normal.     ED Treatments / Results  Labs (all labs ordered are listed, but only abnormal results are displayed) Labs Reviewed  CBC WITH DIFFERENTIAL/PLATELET  COMPREHENSIVE METABOLIC PANEL  I-STAT TROPOININ, ED    EKG  EKG Interpretation  Date/Time:  Monday May 12 2016 14:25:47 EDT Ventricular Rate:  71 PR Interval:    QRS Duration: 103 QT Interval:  416 QTC Calculation: 453 R Axis:   6 Text Interpretation:  Sinus rhythm Low voltage, precordial leads Confirmed by Cleston Lautner  MD, Hanz Winterhalter [phone]) on 05/12/2016 4:48:35 PM       Radiology Dg Chest 2 View  Result Date: 05/12/2016 CLINICAL DATA:  Shortness of breath and pain EXAM: CHEST  2 VIEW COMPARISON:  09/29/2015 chest radiograph. FINDINGS: Stable cardiomediastinal silhouette with  normal heart size. No pneumothorax. No pleural effusion. No pulmonary edema. No acute consolidative airspace disease. Questionable 5 mm pulmonary nodule in the right upper lung, not previously seen. IMPRESSION: 1. No acute cardiopulmonary disease. 2. Questionable 5 mm pulmonary nodule in the right upper lung, not previously seen. Recommend short-term outpatient evaluation with unenhanced chest CT. Electronically Signed   By: Ilona Sorrel M.D.   On: 05/12/2016 15:00    Procedures Procedures (including critical care time)  Medications Ordered in ED Medications  nitroGLYCERIN (NITROSTAT) SL tablet 0.4 mg (0.4 mg Sublingual Given 05/12/16 1713)  morphine 4 MG/ML injection 4 mg (not administered)  sodium chloride 0.9 %  bolus 500 mL (500 mLs Intravenous New Bag/Given 05/12/16 1443)  morphine 4 MG/ML injection 4 mg (4 mg Intravenous Given 05/12/16 1523)  ondansetron (ZOFRAN) injection 4 mg (4 mg Intravenous Given 05/12/16 1523)     Initial Impression / Assessment and Plan / ED Course  I have reviewed the triage vital signs and the nursing notes.  Pertinent labs & imaging results that were available during my care of the patient were reviewed by me and considered in my medical decision making (see chart for details).     Patient had chest discomfort that was helped by nitroglycerin and morphine. First troponin negative. Patient will be admitted to hospital for further workup of chest pain  Final Clinical Impressions(s) / ED Diagnoses   Final diagnoses:  Atypical chest pain    New Prescriptions New Prescriptions   No medications on file     Milton Ferguson, MD 05/12/16 1727

## 2016-05-12 NOTE — ED Notes (Signed)
ED Provider at bedside. 

## 2016-05-12 NOTE — ED Triage Notes (Signed)
Patient brought from home by [facility] for chest pain while sitting in chair then began to feel short of breath.  States she began to have tingling to left lowe lip, fingers and left leg- left toes.  States she does have hx of anxiety.  No deficits noted.  Per EMS patient had nitro paste with pain relief. Also had 324 baby ASA given by EMS.    EDP made aware of tingling symptoms at 1420.
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
## 2016-05-12 NOTE — ED Notes (Signed)
Per EDP, remove nitro paste. Orders followed by Probation officer.

## 2016-05-13 ENCOUNTER — Encounter (HOSPITAL_COMMUNITY): Payer: Self-pay

## 2016-05-13 ENCOUNTER — Observation Stay (HOSPITAL_BASED_OUTPATIENT_CLINIC_OR_DEPARTMENT_OTHER): Payer: Self-pay

## 2016-05-13 DIAGNOSIS — R072 Precordial pain: Secondary | ICD-10-CM

## 2016-05-13 DIAGNOSIS — R079 Chest pain, unspecified: Secondary | ICD-10-CM

## 2016-05-13 LAB — NM MYOCAR MULTI W/SPECT W/WALL MOTION / EF
CHL CUP NUCLEAR SDS: 1
CHL CUP RESTING HR STRESS: 75 {beats}/min
CSEPPHR: 116 {beats}/min
LV dias vol: 66 mL (ref 46–106)
LV sys vol: 16 mL
RATE: 0.64
SRS: 0
SSS: 1
TID: 1

## 2016-05-13 LAB — LIPID PANEL
Cholesterol: 140 mg/dL (ref 0–200)
HDL: 37 mg/dL — ABNORMAL LOW (ref >40)
LDL CALC: 84 mg/dL (ref 0–99)
TRIGLYCERIDES: 93 mg/dL (ref <150)
Total CHOL/HDL Ratio: 3.8 RATIO
VLDL: 19 mg/dL (ref 0–40)

## 2016-05-13 LAB — TROPONIN I: Troponin I: <0.03 ng/mL (ref <0.03)

## 2016-05-13 LAB — ECHOCARDIOGRAM COMPLETE
Height: 65 in
WEIGHTICAEL: 2772.8 [oz_av]

## 2016-05-13 MED ORDER — SODIUM CHLORIDE 0.9 % IV SOLN: 250.0000 mL | INTRAVENOUS | Status: DC | PRN

## 2016-05-13 MED ORDER — REGADENOSON 0.4 MG/5ML IV SOLN
INTRAVENOUS | Status: AC
  Administered 2016-05-13: 0.4 mg via INTRAVENOUS
  Filled 2016-05-13: qty 5

## 2016-05-13 MED ORDER — REGADENOSON 0.4 MG/5ML IV SOLN
0.4000 mg | Freq: Once | INTRAVENOUS | Status: AC
  Administered 2016-05-13: 0.4 mg via INTRAVENOUS
  Filled 2016-05-13: qty 5

## 2016-05-13 MED ORDER — TECHNETIUM TC 99M TETROFOSMIN IV KIT
30.0000 | PACK | Freq: Once | INTRAVENOUS | Status: AC | PRN
  Administered 2016-05-13: 33 via INTRAVENOUS

## 2016-05-13 MED ORDER — SODIUM CHLORIDE 0.9% FLUSH
INTRAVENOUS | Status: AC
  Administered 2016-05-13: 10 mL via INTRAVENOUS
  Filled 2016-05-13: qty 10

## 2016-05-13 MED ORDER — ALUM & MAG HYDROXIDE-SIMETH 200-200-20 MG/5ML PO SUSP
15.0000 mL | ORAL | 0 refills | Status: DC | PRN
Start: 2016-05-13 — End: 2016-09-26

## 2016-05-13 MED ORDER — SODIUM CHLORIDE 0.9% FLUSH
3.0000 mL | INTRAVENOUS | Status: DC | PRN
  Administered 2016-05-13: 3 mL via INTRAVENOUS
  Filled 2016-05-13: qty 3

## 2016-05-13 MED ORDER — ALUM & MAG HYDROXIDE-SIMETH 200-200-20 MG/5ML PO SUSP: 15.0000 mL | ORAL | Status: DC | PRN

## 2016-05-13 MED ORDER — SODIUM CHLORIDE 0.9% FLUSH
3.0000 mL | Freq: Two times a day (BID) | INTRAVENOUS | Status: DC
  Administered 2016-05-13: 3 mL via INTRAVENOUS

## 2016-05-13 MED ORDER — TECHNETIUM TC 99M TETROFOSMIN IV KIT
10.0000 | PACK | Freq: Once | INTRAVENOUS | Status: AC | PRN
  Administered 2016-05-13: 11 via INTRAVENOUS

## 2016-05-13 NOTE — Plan of Care (Signed)
Problem: Pain Managment: Goal: General experience of comfort will improve Outcome: Not Progressing Pt continues to c/o chest pain 7-8/10. Morphine ordered q2h, given x2 this shift so far. Troponins are negative so far. Will continue to monitor pt.
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
## 2016-05-13 NOTE — Progress Notes (Signed)
Atypical chest pain, nuclear stress test is negative for ischemia. We will f/u echo results, do not anticipate any further testing at this time.    Carlyle Dolly MD

## 2016-05-13 NOTE — Discharge Summary (Signed)
Physician Discharge Summary  Heather [name] TIR:[id number] DOB: Feb 03, 1965 DOA: 05/12/2016  PCP: Timmothy Euler, MD  Admit date: 05/12/2016 Discharge date: 05/13/2016  Admitted From: Home  Disposition:  Home   Recommendations for Outpatient Follow-up:  1. Follow up with PCP in 1-2 weeks 2. Discuss referral to cardiology with PCP 3. Please obtain BMP/CBC in one week   Home Health: No   Equipment/Devices: None  Discharge Condition: stable CODE STATUS: Full code Diet recommendation: heart healthy  Brief/Interim Summary: Heather [name] is a 51 y.o. female with medical history significant of reported CVA (see below), GERD, COPD, and depression/anxiety presenting with diffuse chest pain with heaviness, radiating along entire left side with tingling and numbness.  Started at 1030 this AM while sitting in a chair and developed "a big sharp pain and then it just ran down my left side".  Constant pain throughout the day, "it just won't go away".  NTG did not help other than mildly easing it off.  Morphine also did not help.  +SOB, has to take deep breaths in order to breathe.  Felt warm when she first had the attack.  Diarrhea this AM x 2.  +nausea.  No h/o similar.  No h/o stress test. +significant life stressors, anxiety.  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  Pain was nothing like this.  Of note, negative head CT in 3/17, 05/15/15, 05/22/15; negative MRI/MRA 05/23/15.  Patient underwent stress test on 05/13/16 which was normal.  Her echocardiogram was also normal.  She was discharged with instructions to follow up with her PCP to discuss further evaluation if her pain persisted.  At time of discharge she had no pain and tolerated her lunch without concern.  Discharge Diagnoses:  Principal Problem:   Chest pain Active Problems:   Generalized anxiety disorder   Tobacco use disorder    Discharge Instructions  Discharge Instructions    Call MD for:  difficulty breathing,  headache or visual disturbances    Complete by:  As directed    Call MD for:  extreme fatigue    Complete by:  As directed    Call MD for:  hives    Complete by:  As directed    Call MD for:  persistant dizziness or light-headedness    Complete by:  As directed    Call MD for:  persistant nausea and vomiting    Complete by:  As directed    Call MD for:  severe uncontrolled pain    Complete by:  As directed    Call MD for:  temperature >100.4    Complete by:  As directed    Diet - low sodium heart healthy    Complete by:  As directed    Discharge instructions    Complete by:  As directed    See your PCP in 1-2 weeks Take medications as prescribed   Increase activity slowly    Complete by:  As directed      Allergies as of 05/13/2016      Reactions   Aleve [naproxen] Hives, Itching   Asa [aspirin] Hives, Itching   Excedrin [aspirin-acetaminophen-caffeine] Hives, Itching   Vicodin [hydrocodone-acetaminophen] Hives, Itching   Pt states she can take acetaminophen with no problems      Medication List    TAKE these medications   alum & mag hydroxide-simeth 200-200-20 MG/5ML suspension Commonly known as:  MAALOX/MYLANTA Take 15 mLs by mouth every 4 (four) hours as needed  for indigestion or heartburn.   aspirin 81 MG chewable tablet Chew 81 mg by mouth daily.       Allergies  Allergen Reactions  . Aleve [Naproxen] Hives and Itching  . Asa [Aspirin] Hives and Itching  . Excedrin [Aspirin-Acetaminophen-Caffeine] Hives and Itching  . Vicodin [Hydrocodone-Acetaminophen] Hives and Itching    Pt states she can take acetaminophen with no problems    Consultations:  Cardiology   Procedures/Studies: Dg Chest 2 View  Result Date: 05/12/2016 CLINICAL DATA:  Shortness of breath and pain EXAM: CHEST  2 VIEW COMPARISON:  09/29/2015 chest radiograph. FINDINGS: Stable cardiomediastinal silhouette with normal heart size. No pneumothorax. No pleural effusion. No pulmonary edema. No  acute consolidative airspace disease. Questionable 5 mm pulmonary nodule in the right upper lung, not previously seen. IMPRESSION: 1. No acute cardiopulmonary disease. 2. Questionable 5 mm pulmonary nodule in the right upper lung, not previously seen. Recommend short-term outpatient evaluation with unenhanced chest CT. Electronically Signed   By: Ilona Sorrel M.D.   On: 05/12/2016 15:00   Nm Myocar Multi W/spect W/wall Motion / Ef  Result Date: 05/13/2016  The study is normal. There are no perfusion defects  This is a low risk study.  The left ventricular ejection fraction is hyperdynamic (>65%).  There was no ST segment deviation noted during stress.     Echo: Left ventricle: The cavity size was normal. Wall thickness was   normal. Systolic function was normal. The estimated ejection   fraction was in the range of 60% to 65%. Wall motion was normal;   there were no regional wall motion abnormalities. Left   ventricular diastolic function parameters were normal. - Aortic valve: Valve area (VTI): 2.07 cm^2. Valve area (Vmax):   2.07 cm^2. Valve area (Vmean): 1.8 cm^2. - Atrial septum: No defect or patent foramen ovale was identified.    Subjective: Patient voices she is happy her stress test is normal.  She is asking if she can get a prescription for pain medication at discharge.  Discharge Exam: Vitals:   05/13/16 0310 05/13/16 0625  BP: 118/70 103/62  Pulse:  75  Resp:  16  Temp:  98.3 F (36.8 C)   Vitals:   05/12/16 2300 05/13/16 0300 05/13/16 0310 05/13/16 0625  BP: (!) 93/57 (!) 85/49 118/70 103/62  Pulse: 74 81  75  Resp: 16 16  16   Temp: 98.6 F (37 C) 98.5 F (36.9 C)  98.3 F (36.8 C)  TempSrc: Oral Oral  Oral  SpO2: 99% 97%  95%  Weight:      Height:        General: Pt is alert, awake, not in acute distress Cardiovascular: RRR, S1/S2 +, no rubs, no gallops Respiratory: CTA bilaterally, no wheezing, no rhonchi Abdominal: Soft, NT, ND, bowel sounds  + Extremities: no edema, no cyanosis    The results of significant diagnostics from this hospitalization (including imaging, microbiology, ancillary and laboratory) are listed below for reference.     Microbiology: No results found for this or any previous visit (from the past 240 hour(s)).   Labs: BNP (last 3 results) No results for input(s): BNP in the last 8760 hours. Basic Metabolic Panel:  Recent Labs Lab 05/12/16 1503  NA 137  K 3.7  CL 108  CO2 22  GLUCOSE 89  BUN 9  CREATININE 0.65  CALCIUM 8.9   Liver Function Tests:  Recent Labs Lab 05/12/16 1503  AST 16  ALT 14  ALKPHOS 70  BILITOT 0.5  PROT 6.5  ALBUMIN 3.7   No results for input(s): LIPASE, AMYLASE in the last 168 hours. No results for input(s): AMMONIA in the last 168 hours. CBC:  Recent Labs Lab 05/12/16 1503  WBC 9.6  NEUTROABS 6.7  HGB 14.4  HCT 40.4  MCV 87.6  PLT 267   Cardiac Enzymes:  Recent Labs Lab 05/12/16 1916 05/13/16 0048 05/13/16 0607  TROPONINI <0.03 <0.03 <0.03   BNP: Invalid input(s): POCBNP CBG: No results for input(s): GLUCAP in the last 168 hours. D-Dimer No results for input(s): DDIMER in the last 72 hours. Hgb A1c No results for input(s): HGBA1C in the last 72 hours. Lipid Profile  Recent Labs  05/13/16 0605  CHOL 140  HDL 37*  LDLCALC 84  TRIG 93  CHOLHDL 3.8   Thyroid function studies  Recent Labs  05/12/16 1504  TSH 0.800   Anemia work up No results for input(s): VITAMINB12, FOLATE, FERRITIN, TIBC, IRON, RETICCTPCT in the last 72 hours. Urinalysis    Component Value Date/Time   COLORURINE YELLOW 09/29/2015 1316   APPEARANCEUR CLEAR 09/29/2015 1316   APPEARANCEUR Clear 04/05/2015 1550   LABSPEC 1.010 09/29/2015 1316   PHURINE 7.0 09/29/2015 1316   GLUCOSEU NEGATIVE 09/29/2015 1316   HGBUR NEGATIVE 09/29/2015 1316   BILIRUBINUR NEGATIVE 09/29/2015 1316   BILIRUBINUR Negative 04/05/2015 1550   KETONESUR NEGATIVE 09/29/2015 1316    PROTEINUR NEGATIVE 09/29/2015 1316   UROBILINOGEN negative 02/01/2015 1649   NITRITE NEGATIVE 09/29/2015 1316   LEUKOCYTESUR TRACE (A) 09/29/2015 1316   LEUKOCYTESUR Negative 04/05/2015 1550   Sepsis Labs Invalid input(s): PROCALCITONIN,  WBC,  LACTICIDVEN Microbiology No results found for this or any previous visit (from the past 240 hour(s)).   Time coordinating discharge: 30 minutes  SIGNED:   Loretha Stapler, MD  [facility] 05/13/2016, 4:22 PM Pager [phone] If 7PM-7AM, please contact night-coverage [email or web address] Password [email or web address]

## 2016-05-13 NOTE — Progress Notes (Signed)
*  PRELIMINARY RESULTS* Echocardiogram 2D Echocardiogram has been performed.  Heather [name] 05/13/2016, 2:24 PM

## 2016-05-14 LAB — HIV ANTIBODY (ROUTINE TESTING W REFLEX): HIV Screen 4th Generation wRfx: NONREACTIVE

## 2016-09-05 ENCOUNTER — Ambulatory Visit: Payer: Self-pay | Admitting: Family Medicine

## 2016-09-09 ENCOUNTER — Encounter: Payer: Self-pay | Admitting: Family Medicine

## 2016-09-26 ENCOUNTER — Observation Stay (HOSPITAL_COMMUNITY)
Admission: EM | Admit: 2016-09-26 | Discharge: 2016-09-28 | Disposition: A | Discharge status: Home / Self Care | Payer: Self-pay | Attending: Internal Medicine | Admitting: Internal Medicine

## 2016-09-26 ENCOUNTER — Emergency Department (HOSPITAL_COMMUNITY): Payer: Self-pay

## 2016-09-26 ENCOUNTER — Encounter (HOSPITAL_COMMUNITY): Payer: Self-pay | Admitting: *Deleted

## 2016-09-26 DIAGNOSIS — R079 Chest pain, unspecified: Secondary | ICD-10-CM | POA: Diagnosis present

## 2016-09-26 DIAGNOSIS — I639 Cerebral infarction, unspecified: Principal | ICD-10-CM | POA: Insufficient documentation

## 2016-09-26 DIAGNOSIS — H539 Unspecified visual disturbance: Secondary | ICD-10-CM | POA: Insufficient documentation

## 2016-09-26 DIAGNOSIS — R200 Anesthesia of skin: Secondary | ICD-10-CM | POA: Insufficient documentation

## 2016-09-26 DIAGNOSIS — R531 Weakness: Secondary | ICD-10-CM

## 2016-09-26 DIAGNOSIS — N83201 Unspecified ovarian cyst, right side: Secondary | ICD-10-CM | POA: Diagnosis present

## 2016-09-26 DIAGNOSIS — F17210 Nicotine dependence, cigarettes, uncomplicated: Secondary | ICD-10-CM | POA: Insufficient documentation

## 2016-09-26 DIAGNOSIS — R072 Precordial pain: Secondary | ICD-10-CM

## 2016-09-26 DIAGNOSIS — F419 Anxiety disorder, unspecified: Secondary | ICD-10-CM | POA: Insufficient documentation

## 2016-09-26 DIAGNOSIS — K219 Gastro-esophageal reflux disease without esophagitis: Secondary | ICD-10-CM | POA: Diagnosis present

## 2016-09-26 DIAGNOSIS — Z7982 Long term (current) use of aspirin: Secondary | ICD-10-CM | POA: Insufficient documentation

## 2016-09-26 DIAGNOSIS — Z885 Allergy status to narcotic agent status: Secondary | ICD-10-CM | POA: Insufficient documentation

## 2016-09-26 DIAGNOSIS — Z8673 Personal history of transient ischemic attack (TIA), and cerebral infarction without residual deficits: Secondary | ICD-10-CM | POA: Insufficient documentation

## 2016-09-26 DIAGNOSIS — G459 Transient cerebral ischemic attack, unspecified: Secondary | ICD-10-CM | POA: Diagnosis present

## 2016-09-26 LAB — COMPREHENSIVE METABOLIC PANEL
ALBUMIN: 4.1 g/dL (ref 3.5–5.0)
ALT: 14 U/L (ref 14–54)
AST: 17 U/L (ref 15–41)
Alkaline Phosphatase: 61 U/L (ref 38–126)
Anion gap: 13 (ref 5–15)
BILIRUBIN TOTAL: 0.7 mg/dL (ref 0.3–1.2)
BUN: 10 mg/dL (ref 6–20)
CO2: 22 mmol/L (ref 22–32)
Calcium: 9 mg/dL (ref 8.9–10.3)
Chloride: 103 mmol/L (ref 101–111)
Creatinine, Ser: 0.83 mg/dL (ref 0.44–1.00)
GFR calc Af Amer: >60 mL/min (ref >60)
GFR calc non Af Amer: >60 mL/min (ref >60)
GLUCOSE: 101 mg/dL — AB (ref 65–99)
POTASSIUM: 3.4 mmol/L — AB (ref 3.5–5.1)
SODIUM: 138 mmol/L (ref 135–145)
TOTAL PROTEIN: 7 g/dL (ref 6.5–8.1)

## 2016-09-26 LAB — I-STAT CHEM 8, ED
BUN: 9 mg/dL (ref 6–20)
CHLORIDE: 105 mmol/L (ref 101–111)
Calcium, Ion: 1.13 mmol/L — ABNORMAL LOW (ref 1.15–1.40)
Creatinine, Ser: 0.8 mg/dL (ref 0.44–1.00)
Glucose, Bld: 97 mg/dL (ref 65–99)
HEMATOCRIT: 41 % (ref 36.0–46.0)
Hemoglobin: 13.9 g/dL (ref 12.0–15.0)
POTASSIUM: 3.6 mmol/L (ref 3.5–5.1)
SODIUM: 140 mmol/L (ref 135–145)
TCO2: 23 mmol/L (ref 22–32)

## 2016-09-26 LAB — CBC
HCT: 39.7 % (ref 36.0–46.0)
HEMOGLOBIN: 14.3 g/dL (ref 12.0–15.0)
MCH: 31.4 pg (ref 26.0–34.0)
MCHC: 36 g/dL (ref 30.0–36.0)
MCV: 87.1 fL (ref 78.0–100.0)
PLATELETS: 257 10*3/uL (ref 150–400)
RBC: 4.56 MIL/uL (ref 3.87–5.11)
RDW: 13.3 % (ref 11.5–15.5)
WBC: 8.2 10*3/uL (ref 4.0–10.5)

## 2016-09-26 LAB — DIFFERENTIAL
Basophils Absolute: 0 10*3/uL (ref 0.0–0.1)
Basophils Relative: 0 %
EOS ABS: 0.2 10*3/uL (ref 0.0–0.7)
Eosinophils Relative: 2 %
LYMPHS ABS: 1.8 10*3/uL (ref 0.7–4.0)
Lymphocytes Relative: 22 %
Monocytes Absolute: 0.4 10*3/uL (ref 0.1–1.0)
Monocytes Relative: 5 %
NEUTROS PCT: 71 %
Neutro Abs: 5.7 10*3/uL (ref 1.7–7.7)

## 2016-09-26 LAB — I-STAT TROPONIN, ED: Troponin i, poc: 0 ng/mL (ref 0.00–0.08)

## 2016-09-26 LAB — CBG MONITORING, ED: GLUCOSE-CAPILLARY: 103 mg/dL — AB (ref 65–99)

## 2016-09-26 LAB — APTT: APTT: 30 s (ref 24–36)

## 2016-09-26 LAB — PROTIME-INR
INR: 1.01
Prothrombin Time: 13.2 seconds (ref 11.4–15.2)

## 2016-09-26 MED ORDER — ASPIRIN 81 MG PO CHEW
324.0000 mg | CHEWABLE_TABLET | Freq: Once | ORAL | Status: AC
  Administered 2016-09-26: 324 mg via ORAL
  Filled 2016-09-26: qty 4

## 2016-09-26 NOTE — Progress Notes (Signed)
Beeper and leslie RN Call time 2144 Exam started 2148, exam finished and sent to soc 2148, exam coompleted and called radiologist 2149

## 2016-09-26 NOTE — ED Notes (Signed)
Tele neuro assessment in progress at this time

## 2016-09-26 NOTE — ED Notes (Signed)
Tele neuro ended

## 2016-09-26 NOTE — ED Notes (Signed)
Tele neuro call at this time with EDP, Dr. Rogene Houston present

## 2016-09-26 NOTE — ED Triage Notes (Signed)
Pt arrives via EMS from home, with c/o left sided weakness on the left side around 7pm per patient spouse.  EMS states that she was also not able to talk on arrival to scene, but has improved since transport. Pt main c/o chest pain tonight.   on assessment, pt had + drift, left upper and lower extremity weakness, states left sided numbness and left sided chest pain. Dr. Rogene Houston notified of patient status, Code stroke initiated.
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
## 2016-09-26 NOTE — ED Notes (Signed)
Tele neuro ended for ph call with Dr. Rogene Houston
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
## 2016-09-26 NOTE — ED Notes (Signed)
Tele neuro attempted assessment, pt is in ct

## 2016-09-27 ENCOUNTER — Observation Stay (HOSPITAL_COMMUNITY): Payer: Self-pay

## 2016-09-27 ENCOUNTER — Observation Stay (HOSPITAL_BASED_OUTPATIENT_CLINIC_OR_DEPARTMENT_OTHER): Payer: Self-pay

## 2016-09-27 DIAGNOSIS — K219 Gastro-esophageal reflux disease without esophagitis: Secondary | ICD-10-CM

## 2016-09-27 DIAGNOSIS — R0789 Other chest pain: Secondary | ICD-10-CM

## 2016-09-27 DIAGNOSIS — R072 Precordial pain: Secondary | ICD-10-CM

## 2016-09-27 DIAGNOSIS — R531 Weakness: Secondary | ICD-10-CM

## 2016-09-27 LAB — LIPID PANEL
Cholesterol: 152 mg/dL (ref 0–200)
HDL: 39 mg/dL — ABNORMAL LOW (ref >40)
LDL CALC: 98 mg/dL (ref 0–99)
TRIGLYCERIDES: 77 mg/dL (ref <150)
Total CHOL/HDL Ratio: 3.9 RATIO
VLDL: 15 mg/dL (ref 0–40)

## 2016-09-27 LAB — HEMOGLOBIN A1C
Hgb A1c MFr Bld: 4.5 % — ABNORMAL LOW (ref 4.8–5.6)
Mean Plasma Glucose: 82.45 mg/dL

## 2016-09-27 LAB — TROPONIN I: Troponin I: <0.03 ng/mL (ref <0.03)

## 2016-09-27 LAB — ECHOCARDIOGRAM COMPLETE
Height: 66 in
WEIGHTICAEL: 2686.4 [oz_av]

## 2016-09-27 LAB — VITAMIN B12: VITAMIN B 12: 216 pg/mL (ref 180–914)

## 2016-09-27 MED ORDER — LORAZEPAM 2 MG/ML IJ SOLN
1.0000 mg | Freq: Once | INTRAMUSCULAR | Status: AC
  Administered 2016-09-27: 1 mg via INTRAVENOUS
  Filled 2016-09-27: qty 1

## 2016-09-27 MED ORDER — SODIUM CHLORIDE 0.9 % IV SOLN
INTRAVENOUS | Status: DC
  Administered 2016-09-27: 01:00:00 via INTRAVENOUS

## 2016-09-27 MED ORDER — STROKE: EARLY STAGES OF RECOVERY BOOK
Freq: Once | Status: DC
  Filled 2016-09-27: qty 1

## 2016-09-27 MED ORDER — POTASSIUM CHLORIDE CRYS ER 20 MEQ PO TBCR
40.0000 meq | EXTENDED_RELEASE_TABLET | Freq: Once | ORAL | Status: AC
  Administered 2016-09-27: 40 meq via ORAL
  Filled 2016-09-27: qty 2

## 2016-09-27 MED ORDER — ASPIRIN 325 MG PO TABS
325.0000 mg | ORAL_TABLET | Freq: Every day | ORAL | Status: DC
  Administered 2016-09-27 – 2016-09-28 (×2): 325 mg via ORAL
  Filled 2016-09-27 (×2): qty 1

## 2016-09-27 MED ORDER — NICOTINE 14 MG/24HR TD PT24
14.0000 mg | MEDICATED_PATCH | Freq: Every day | TRANSDERMAL | Status: DC
  Administered 2016-09-28 (×2): 14 mg via TRANSDERMAL
  Filled 2016-09-27 (×2): qty 1

## 2016-09-27 MED ORDER — ENOXAPARIN SODIUM 40 MG/0.4ML ~~LOC~~ SOLN
40.0000 mg | SUBCUTANEOUS | Status: DC
  Administered 2016-09-27: 40 mg via SUBCUTANEOUS
  Filled 2016-09-27: qty 0.4

## 2016-09-27 MED ORDER — SENNOSIDES-DOCUSATE SODIUM 8.6-50 MG PO TABS: 1.0000 | ORAL_TABLET | Freq: Every evening | ORAL | Status: DC | PRN

## 2016-09-27 MED ORDER — ASPIRIN 300 MG RE SUPP: 300.0000 mg | Freq: Every day | RECTAL | Status: DC

## 2016-09-27 NOTE — Progress Notes (Signed)
PROGRESS NOTE    Heather [name]  VHQ:[id number] DOB: Jul 26, 1965 DOA: 09/26/2016 PCP: Patient, No Pcp Per    Brief Narrative:  51 year old female who presented to the hospital with complaints of left-sided chest pain and left arm/leg weakness and numbness. She was admitted for further workup.   Assessment & Plan:   Active Problems:   Esophageal reflux   Cyst of right ovary   Chest pain   Left-sided weakness   1. Left-sided weakness. Etiology is unclear. MRI is negative for stroke, MRA did not show any significant findings. Carotid Dopplers and echocardiogram were also unrevealing. She does complain of significant neck pain with associated numbness and weakness on her left side. She may have some degree of cervical radiculopathy. Will pursue MRI of C-spine to further evaluate. Of note, the patient's exam is somewhat inconsistent. Although she does complain of significant weakness on the left side, she is often able to lift her left arm/leg when distracted. She likely has some degree of psychosomatic component 2. Chest pain. Possibly radiating from C-spine. Cardiac enzymes are negative. Echocardiogram does not show any acute findings.   DVT prophylaxis: lovenox Code Status: full code Family Communication: discussed with family at the bedside Disposition Plan: discharge home once improved   Consultants:     Procedures:  ECHO: - Upper normal LV wall thickness with LVEF 60-65% and grossly    normal diastolic function. No obvious PFO or ASD.  Antimicrobials:       Subjective: Patient said that she still has weakness on her left side. Does describe left-sided neck pain that radiates down her left chest and is worse when she turns her head to the right.  Objective: Vitals:   09/27/16 0747 09/27/16 0945 09/27/16 1145 09/27/16 1454  BP: 116/64 110/63 112/66 105/66  Pulse: 64 68 70 74  Resp: 18 18 18 18   Temp: 98.4 F (36.9 C) 97.6 F (36.4 C) 97.9 F (36.6 C) 98.3 F  (36.8 C)  TempSrc: Oral Oral Oral Oral  SpO2: 99% 98% 99% 99%  Weight:      Height:        Intake/Output Summary (Last 24 hours) at 09/27/16 1727 Last data filed at 09/27/16 0300  Gross per 24 hour  Intake            91.67 ml  Output                0 ml  Net            91.67 ml   Filed Weights   09/27/16 0040 09/27/16 0100  Weight: 76.2 kg (167 lb 14.4 oz) 76.2 kg (167 lb 14.4 oz)    Examination:  General exam: Appears calm and comfortable  Respiratory system: Clear to auscultation. Respiratory effort normal. Cardiovascular system: S1 & S2 heard, RRR. No JVD, murmurs, rubs, gallops or clicks. No pedal edema. Gastrointestinal system: Abdomen is nondistended, soft and nontender. No organomegaly or masses felt. Normal bowel sounds heard. Central nervous system: Alert and oriented. Strength exam is inconsistent Extremities: Symmetric Skin: No rashes, lesions or ulcers Psychiatry: Judgement and insight appear normal. Mood & affect appropriate.     Data Reviewed: I have personally reviewed following labs and imaging studies  CBC:  Recent Labs Lab 09/26/16 2148 09/26/16 2202  WBC 8.2  --   NEUTROABS 5.7  --   HGB 14.3 13.9  HCT 39.7 41.0  MCV 87.1  --   PLT 257  --    Basic  Metabolic Panel:  Recent Labs Lab 09/26/16 2148 09/26/16 2202  NA 138 140  K 3.4* 3.6  CL 103 105  CO2 22  --   GLUCOSE 101* 97  BUN 10 9  CREATININE 0.83 0.80  CALCIUM 9.0  --    GFR: Estimated Creatinine Clearance: 88.8 mL/min (by C-G formula based on SCr of 0.8 mg/dL). Liver Function Tests:  Recent Labs Lab 09/26/16 2148  AST 17  ALT 14  ALKPHOS 61  BILITOT 0.7  PROT 7.0  ALBUMIN 4.1   No results for input(s): LIPASE, AMYLASE in the last 168 hours. No results for input(s): AMMONIA in the last 168 hours. Coagulation Profile:  Recent Labs Lab 09/26/16 2148  INR 1.01   Cardiac Enzymes:  Recent Labs Lab 09/27/16 0454 09/27/16 1119  TROPONINI <0.03 <0.03   BNP  (last 3 results) No results for input(s): PROBNP in the last 8760 hours. HbA1C:  Recent Labs  09/27/16 0454  HGBA1C 4.5*   CBG:  Recent Labs Lab 09/26/16 2206  GLUCAP 103*   Lipid Profile:  Recent Labs  09/27/16 0454  CHOL 152  HDL 39*  LDLCALC 98  TRIG 77  CHOLHDL 3.9   Thyroid Function Tests: No results for input(s): TSH, T4TOTAL, FREET4, T3FREE, THYROIDAB in the last 72 hours. Anemia Panel: No results for input(s): VITAMINB12, FOLATE, FERRITIN, TIBC, IRON, RETICCTPCT in the last 72 hours. Sepsis Labs: No results for input(s): PROCALCITON, LATICACIDVEN in the last 168 hours.  No results found for this or any previous visit (from the past 240 hour(s)).       Radiology Studies: Mr Brain 1 Contrast  Result Date: 09/27/2016 CLINICAL DATA:  51 year old female with onset of left side weakness at 1900 hours the day of admission. Patient refused IV tPA. EXAM: MRI HEAD WITHOUT CONTRAST MRA HEAD WITHOUT CONTRAST TECHNIQUE: Multiplanar, multiecho pulse sequences of the brain and surrounding structures were obtained without intravenous contrast. Angiographic images of the head were obtained using MRA technique without contrast. COMPARISON:  Head CT without contrast 09/26/2016. Brain MRI and intracranial MRA 05/23/2015. FINDINGS: MRI HEAD FINDINGS Brain: Cerebral volume is normal. No restricted diffusion to suggest acute infarction. No midline shift, mass effect, evidence of mass lesion, ventriculomegaly, extra-axial collection or acute intracranial hemorrhage. Cervicomedullary junction and pituitary are within normal limits. Stable dystrophic calcification adjacent to the right frontal horn. No other areas of abnormal susceptibility in the brain. Pear[place] Cables and white matter signal is otherwise stable and normal for age. No encephalomalacia identified. Vascular: Major intracranial vascular flow voids are stable since 2017. Skull and upper cervical spine: Negative. Sinuses/Orbits: Stable an  normal orbits soft tissues. Regressed maxillary sinus mucosal thickening. Other: Stable visible internal auditory structures. Stable mild left mastoid effusion. Negative nasopharynx. Scalp and face soft tissues appear negative. MRA HEAD FINDINGS Stable posterior circulation with antegrade flow in the distal vertebral arteries, the left is dominant B on the right PICA origin which remains patent. Patent left PICA origin. No distal vertebral artery or basilar stenosis. Normal SCA and PCA origins. Both posterior communicating arteries are present, the right is larger. Bilateral PCA branches are within normal limits. Stable antegrade flow in both ICA siphons. No siphon stenosis. Ophthalmic and posterior communicating artery origins appear stable and normal. Stable and normal carotid termini, MCA and ACA origins. Stable and normal visualized bilateral MCA branches. Stable anterior communicating artery which appears mildly ectatic. Visible ACA branches are stable and within normal limits. IMPRESSION: 1.  No acute intracranial abnormality. 2. Stable  and unremarkable for age noncontrast MRI appearance of the brain. 3. Stable and normal intracranial MRA. Electronically Signed   By: Genevie Ann M.D.   On: 09/27/2016 11:20   US Carotid Bilateral (at [facility] And [facility] Only)  Result Date: 09/27/2016 CLINICAL DATA:  CVA.  Left-sided weakness. EXAM: BILATERAL CAROTID DUPLEX ULTRASOUND TECHNIQUE: Pear[place] Cables scale imaging, color Doppler and duplex ultrasound were performed of bilateral carotid and vertebral arteries in the neck. COMPARISON:  Head CT 09/26/2016 FINDINGS: Criteria: Quantification of carotid stenosis is based on velocity parameters that correlate the residual internal carotid diameter with NASCET-based stenosis levels, using the diameter of the distal internal carotid lumen as the denominator for stenosis measurement. The following velocity measurements were obtained: RIGHT ICA:  91 cm/sec CCA:  93 cm/sec SYSTOLIC ICA/CCA RATIO:   1.0 DIASTOLIC ICA/CCA RATIO:  1.4 ECA:  84 cm/sec LEFT ICA:  79 cm/sec CCA:  83 cm/sec SYSTOLIC ICA/CCA RATIO:  1.0 DIASTOLIC ICA/CCA RATIO:  1.6 ECA:  79 cm/sec RIGHT CAROTID ARTERY: Right carotid arteries are patent without significant plaque or stenosis. Normal waveforms and velocities in the internal carotid artery. External carotid artery is patent with normal waveform. RIGHT VERTEBRAL ARTERY: Antegrade flow and normal waveform in the right vertebral artery. LEFT CAROTID ARTERY: Left carotid arteries are patent without significant plaque or stenosis. Normal waveforms and velocities in the internal carotid artery. External carotid artery is patent with normal waveform. LEFT VERTEBRAL ARTERY: Antegrade flow and normal waveform in the left vertebral artery. IMPRESSION: Normal carotid artery duplex examination. No significant plaque or stenosis in the carotid arteries. Patent vertebral arteries with antegrade flow. Electronically Signed   By: Markus Daft M.D.   On: 09/27/2016 10:20   Dg Chest Portable 1 View  Result Date: 09/26/2016 CLINICAL DATA:  Chest pain EXAM: PORTABLE CHEST 1 VIEW COMPARISON:  05/12/2016 FINDINGS: The heart size and mediastinal contours are within normal limits. Both lungs are clear. The visualized skeletal structures are unremarkable. IMPRESSION: No active disease. Electronically Signed   By: Donavan Foil M.D.   On: 09/26/2016 22:41   Mr Jodene Nam Head/brain TG Cm  Result Date: 09/27/2016 CLINICAL DATA:  51 year old female with onset of left side weakness at 1900 hours the day of admission. Patient refused IV tPA. EXAM: MRI HEAD WITHOUT CONTRAST MRA HEAD WITHOUT CONTRAST TECHNIQUE: Multiplanar, multiecho pulse sequences of the brain and surrounding structures were obtained without intravenous contrast. Angiographic images of the head were obtained using MRA technique without contrast. COMPARISON:  Head CT without contrast 09/26/2016. Brain MRI and intracranial MRA 05/23/2015. FINDINGS: MRI  HEAD FINDINGS Brain: Cerebral volume is normal. No restricted diffusion to suggest acute infarction. No midline shift, mass effect, evidence of mass lesion, ventriculomegaly, extra-axial collection or acute intracranial hemorrhage. Cervicomedullary junction and pituitary are within normal limits. Stable dystrophic calcification adjacent to the right frontal horn. No other areas of abnormal susceptibility in the brain. [place] Cables and white matter signal is otherwise stable and normal for age. No encephalomalacia identified. Vascular: Major intracranial vascular flow voids are stable since 2017. Skull and upper cervical spine: Negative. Sinuses/Orbits: Stable an normal orbits soft tissues. Regressed maxillary sinus mucosal thickening. Other: Stable visible internal auditory structures. Stable mild left mastoid effusion. Negative nasopharynx. Scalp and face soft tissues appear negative. MRA HEAD FINDINGS Stable posterior circulation with antegrade flow in the distal vertebral arteries, the left is dominant B on the right PICA origin which remains patent. Patent left PICA origin. No distal vertebral artery or basilar stenosis. Normal SCA  and PCA origins. Both posterior communicating arteries are present, the right is larger. Bilateral PCA branches are within normal limits. Stable antegrade flow in both ICA siphons. No siphon stenosis. Ophthalmic and posterior communicating artery origins appear stable and normal. Stable and normal carotid termini, MCA and ACA origins. Stable and normal visualized bilateral MCA branches. Stable anterior communicating artery which appears mildly ectatic. Visible ACA branches are stable and within normal limits. IMPRESSION: 1.  No acute intracranial abnormality. 2. Stable and unremarkable for age noncontrast MRI appearance of the brain. 3. Stable and normal intracranial MRA. Electronically Signed   By: Genevie Ann M.D.   On: 09/27/2016 11:20   Ct Head Code Stroke Wo Contrast  Result Date:  09/26/2016 CLINICAL DATA:  Code stroke. LEFT-sided weakness. History of stroke, tobacco use. EXAM: CT HEAD WITHOUT CONTRAST TECHNIQUE: Contiguous axial images were obtained from the base of the skull through the vertex without intravenous contrast. COMPARISON:  MRI of the head May 23, 2015 and CT HEAD May 22, 2015 FINDINGS: BRAIN: No intraparenchymal hemorrhage, mass effect nor midline shift. The ventricles and sulci are normal. No acute large vascular territory infarcts. RIGHT frontal intraparenchymal calcification is unchanged. Asymmetric RIGHT basal ganglia mineralization. No abnormal extra-axial fluid collections. Basal cisterns are patent. VASCULAR: Unremarkable. SKULL/SOFT TISSUES: No skull fracture. No significant soft tissue swelling. ORBITS/SINUSES: The included ocular globes and orbital contents are normal.The mastoid aircells and included paranasal sinuses are well-aerated. OTHER: None. ASPECTS [facility] Stroke Program Early CT Score) - Ganglionic level infarction (caudate, lentiform nuclei, internal capsule, insula, M1-M3 cortex): 7 - Supraganglionic infarction (M4-M6 cortex): 3 Total score (0-10 with 10 being normal): 10 IMPRESSION: 1. No acute intracranial process. 2. Nonspecific RIGHT frontal calcification, otherwise negative noncontrast CT HEAD. 3. ASPECTS is 10. 4. Critical Value/emergent results were called by telephone at the time of interpretation on 09/26/2016 at 10:02 pm to Dr. Fredia Sorrow , who verbally acknowledged these results. Electronically Signed   By: Elon Alas M.D.   On: 09/26/2016 22:03        Scheduled Meds: .  stroke: mapping our early stages of recovery book   Does not apply Once  . aspirin  300 mg Rectal Daily   Or  . aspirin  325 mg Oral Daily  . enoxaparin (LOVENOX) injection  40 mg Subcutaneous Q24H   Continuous Infusions:   LOS: 0 days    Time spent: 5mins    MEMON,JEHANZEB, MD [facility] Pager [phone]  If 7PM-7AM, please  contact night-coverage [email or web address] Password [facility] 09/27/2016, 5:27 PM

## 2016-09-27 NOTE — Progress Notes (Signed)
Patient states she is not allergic to asa.

## 2016-09-27 NOTE — Progress Notes (Signed)
Documentation of patient passing swallow screen in the ED.  Dr. Roderic Palau on unit and gave order for heart healthy diet.

## 2016-09-27 NOTE — Progress Notes (Signed)
Late entry:  Patient requested Nicotine patch.  Dr. Roderic Palau notified via text page.

## 2016-09-27 NOTE — Evaluation (Signed)
Physical Therapy Evaluation Patient Details Name: Heather [name] MRN: [id number] DOB: 02-17-1965 Today's Date: 09/27/2016   History of Present Illness  Heather [name]  is a 51 y.o. female, With history of depression, emphysema, GERD continue hospital with left-sided chest pain with numbness involving left upper and lower extremity. Patient says that this all started after she had argument with her son. She complained of chest pain with numbness of left arm. She also complains of weakness of left arm and leg.  Clinical Impression  Pt states that she normally uses a rolling walker but this is due to weakness and decreased balance in her right leg and she is now experiencing weakness in her Left leg.  Pt is I in bed mobility and ambulation with rolling walker but will benefit from outpatient therapy to improve balance and strength.     Follow Up Recommendations Outpatient PT    Equipment Recommendations  None recommended by PT    Recommendations for Other Services   none    Precautions / Restrictions Precautions Precautions: None Restrictions Weight Bearing Restrictions: No      Mobility  Bed Mobility Overal bed mobility: Independent                Transfers Overall transfer level: Independent Equipment used: Rolling walker (2 wheeled)                Ambulation/Gait Ambulation/Gait assistance: Modified independent (Device/Increase time) Ambulation Distance (Feet): 200 Feet Assistive device: Rolling walker (2 wheeled) Gait Pattern/deviations: Decreased step length - left;Decreased step length - right   Gait velocity interpretation: Below normal speed for age/gender    Stairs            Wheelchair Mobility    Modified Rankin (Stroke Patients Only)             Pertinent Vitals/Pain Pain Assessment: 0-10 Pain Score: 6  Pain Location: chest  Pain Descriptors / Indicators: Aching Pain Intervention(s): Limited activity within patient's tolerance     Home Living Family/patient expects to be discharged to:: Private residence Living Arrangements: Spouse/significant other Available Help at Discharge: Family Type of Home: House Home Access: Stairs to enter Entrance Stairs-Rails: Right Entrance Stairs-Number of Steps: 3 Home Layout: One level Home Equipment: Environmental consultant - 2 wheels      Prior Function Level of Independence: Independent with assistive device(s)                  Extremity/Trunk Assessment        Lower Extremity Assessment Lower Extremity Assessment: Generalized weakness       Communication   Communication: No difficulties  Cognition    alert and oriented                                           General Comments General comments (skin integrity, edema, etc.): Not formally assessed with a balance assessement but standing 3 static plus; standing dynamic 3/5    Exercises  Encouraged to walk more at home;   Assessment/Plan    PT Assessment All further PT needs can be met in the next venue of care  PT Problem List Decreased strength;Decreased activity tolerance;Decreased balance       PT Treatment Interventions      PT Goals (Current goals can be found in the Care Plan section)  M-PAC PT "6 Clicks" Daily Activity  Outcome Measure Difficulty turning over in bed (including adjusting bedclothes, sheets and blankets)?: None Difficulty moving from lying on back to sitting on the side of the bed? : None Difficulty sitting down on and standing up from a chair with arms (e.g., wheelchair, bedside commode, etc,.)?: A Little Help needed moving to and from a bed to chair (including a wheelchair)?: None Help needed walking in hospital room?: A Little Help needed climbing 3-5 steps with a railing? : A Little 6 Click Score: 21    End of Session Equipment Utilized During Treatment: Gait belt Activity Tolerance: Patient tolerated treatment well Patient left: in bed;with  call bell/phone within reach   PT Visit Diagnosis: Unsteadiness on feet (R26.81)    Time: 1030-1108 PT Time Calculation (min) (ACUTE ONLY): 38 min   Charges:   PT Evaluation $PT Eval Low Complexity: 1 Low     PT G Codes:   PT G-Codes **NOT FOR INPATIENT CLASS** Functional Assessment Tool Used: AM-PAC 6 Clicks Basic Mobility Functional Limitation: Mobility: Walking and moving around Mobility: Walking and Moving Around Current Status (G3943): At least 20 percent but less than 40 percent impaired, limited or restricted Mobility: Walking and Moving Around Goal Status [phone]): At least 20 percent but less than 40 percent impaired, limited or restricted Mobility: Walking and Moving Around Discharge Status [phone]): At least 20 percent but less than 40 percent impaired, limited or restricted  [name], PT CLT [phone] 09/27/2016, 11:09 AM

## 2016-09-27 NOTE — Progress Notes (Signed)
  Echocardiogram 2D Echocardiogram has been performed.  Heather [name] 09/27/2016, 11:02 AM

## 2016-09-28 ENCOUNTER — Observation Stay (HOSPITAL_COMMUNITY): Payer: Self-pay

## 2016-09-28 NOTE — Progress Notes (Signed)
Order for nicotine patch put in to being on 09/28/16 at 10:00 am.

## 2016-09-28 NOTE — Discharge Summary (Signed)
Physician Discharge Summary  ATZIRY BARANSKI PYP:[id number] DOB: July 03, 1965 DOA: 09/26/2016  PCP: Patient, No Pcp Per  Admit date: 09/26/2016 Discharge date: 09/28/2016  Admitted From: Home Disposition:  Home  Recommendations for Outpatient Follow-up:  1. Follow up with PCP in 1-2 weeks  Discharge ConditionStable CODE STATUS:Full code Diet recommendation: Heart Healthy  Brief/Interim Summary: 51 year old female, presents to the hospital with left-sided chest pain and left arm/leg weakness and numbness. She was admitted for further workup. She reported that her symptoms have gotten worse after undergoing emotional stress from family. MRI of the brain/MRA head/echocardiogram/carotid Dopplers were unrevealing. She also did complain of some neck pain. MRI of the C-spine was obtained which was relatively unrevealing. Cardiac enzymes were found to be negative. Echocardiogram did not show any wall motion abnormalities. Patient's physical exam appear to have some inconsistencies. She reported that she was unable to lift her left leg, but later in the day, staff reported that she was walking to the bathroom. She also reported that she had difficulty raising her left arm, although when distracted, it appears that she was moving it without difficulty. She likely has a psychosomatic component to her findings. No further inpatient workup planned. She is feeling better. If symptoms recur, would consider further neurology follow-up. We'll discharge home today.  Discharge Diagnoses:  Active Problems:   Esophageal reflux   Cyst of right ovary   Chest pain   Left-sided weakness    Discharge Instructions  Discharge Instructions    Diet - low sodium heart healthy    Complete by:  As directed    Increase activity slowly    Complete by:  As directed      Allergies as of 09/28/2016      Reactions   Aleve [naproxen] Hives, Itching   Asa [aspirin] Hives, Itching   Excedrin [aspirin-acetaminophen-caffeine]  Hives, Itching   Morphine And Related Itching   Vicodin [hydrocodone-acetaminophen] Hives, Itching   Pt states she can take acetaminophen with no problems      Medication List    TAKE these medications   aspirin 81 MG chewable tablet Chew 81 mg by mouth 2 (two) times daily.            Discharge Care Instructions        Start     Ordered   09/28/16 0000  Increase activity slowly     09/28/16 1552   09/28/16 0000  Diet - low sodium heart healthy     09/28/16 1552     Follow-up Information    Primary Care Physician Follow up.   Why:  Schedule hospital follow-up within 2 weeks         Allergies  Allergen Reactions  . Aleve [Naproxen] Hives and Itching  . Asa [Aspirin] Hives and Itching  . Excedrin [Aspirin-Acetaminophen-Caffeine] Hives and Itching  . Morphine And Related Itching  . Vicodin [Hydrocodone-Acetaminophen] Hives and Itching    Pt states she can take acetaminophen with no problems    Consultations:      Procedures/Studies: Mr Brain Wo Contrast  Result Date: 09/27/2016 CLINICAL DATA:  51 year old female with onset of left side weakness at 1900 hours the day of admission. Patient refused IV tPA. EXAM: MRI HEAD WITHOUT CONTRAST MRA HEAD WITHOUT CONTRAST TECHNIQUE: Multiplanar, multiecho pulse sequences of the brain and surrounding structures were obtained without intravenous contrast. Angiographic images of the head were obtained using MRA technique without contrast. COMPARISON:  Head CT without contrast 09/26/2016. Brain MRI and intracranial MRA  05/23/2015. FINDINGS: MRI HEAD FINDINGS Brain: Cerebral volume is normal. No restricted diffusion to suggest acute infarction. No midline shift, mass effect, evidence of mass lesion, ventriculomegaly, extra-axial collection or acute intracranial hemorrhage. Cervicomedullary junction and pituitary are within normal limits. Stable dystrophic calcification adjacent to the right frontal horn. No other areas of abnormal  susceptibility in the brain. Pearline Cables and white matter signal is otherwise stable and normal for age. No encephalomalacia identified. Vascular: Major intracranial vascular flow voids are stable since 2017. Skull and upper cervical spine: Negative. Sinuses/Orbits: Stable an normal orbits soft tissues. Regressed maxillary sinus mucosal thickening. Other: Stable visible internal auditory structures. Stable mild left mastoid effusion. Negative nasopharynx. Scalp and face soft tissues appear negative. MRA HEAD FINDINGS Stable posterior circulation with antegrade flow in the distal vertebral arteries, the left is dominant B on the right PICA origin which remains patent. Patent left PICA origin. No distal vertebral artery or basilar stenosis. Normal SCA and PCA origins. Both posterior communicating arteries are present, the right is larger. Bilateral PCA branches are within normal limits. Stable antegrade flow in both ICA siphons. No siphon stenosis. Ophthalmic and posterior communicating artery origins appear stable and normal. Stable and normal carotid termini, MCA and ACA origins. Stable and normal visualized bilateral MCA branches. Stable anterior communicating artery which appears mildly ectatic. Visible ACA branches are stable and within normal limits. IMPRESSION: 1.  No acute intracranial abnormality. 2. Stable and unremarkable for age noncontrast MRI appearance of the brain. 3. Stable and normal intracranial MRA. Electronically Signed   By: Genevie Ann M.D.   On: 09/27/2016 11:20   Mr Cervical Spine Wo Contrast  Result Date: 09/28/2016 CLINICAL DATA:  51 year old female with acute onset of left side weakness. Negative brain MRI. EXAM: MRI CERVICAL SPINE WITHOUT CONTRAST TECHNIQUE: Multiplanar, multisequence MR imaging of the cervical spine was performed. No intravenous contrast was administered. COMPARISON:  Brain MRI and intracranial MRA 09/27/2016 and earlier. FINDINGS: Alignment: Straightening of cervical lordosis.  Vertebrae: Visualized bone marrow signal is within normal limits. No marrow edema or evidence of acute osseous abnormality. Cord: Spinal cord signal is within normal limits at all visualized levels. Posterior Fossa, vertebral arteries, paraspinal tissues: Stable and negative visualized brain parenchyma. Preserved major vascular flow voids in the neck, the left vertebral artery appears dominant. Subcentimeter T2 hyperintense area in the visible left thyroid lobe does not meet consensus criteria for ultrasound follow-up. Other visible neck soft tissues are negative. Disc levels: C2-C3:  Negative. C3-C4:  Negative. C4-C5: Tiny central disc protrusion with annular fissure (series 7, image 18). No significant stenosis. C5-C6: Disc space loss. Circumferential disc bulge and endplate spurring with broad-based posterior component (series 7, image 24). Mild ligament flavum hypertrophy. Mild spinal stenosis. Mild if any spinal cord mass effect. Bilateral foraminal disc and/or endplate spurring resulting in mild bilateral C6 foraminal stenosis. C6-C7: Mild circumferential disc bulge slightly eccentric to the left (series 3, image 28). Mild to moderate ligament flavum hypertrophy. Mild spinal stenosis, no cord mass effect. No significant foraminal stenosis. C7-T1:  Mild facet hypertrophy.  No stenosis. The visible T1-T2 level is negative. IMPRESSION: 1. 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 2. Mild disc degeneration at C6-C7 resulting in mild spinal, but no foraminal stenosis. 3.  No acute osseous abnormality. Electronically Signed   By: Genevie Ann M.D.   On: 09/28/2016 10:59   US Carotid Bilateral (at [facility] Only)  Result Date:  09/27/2016 CLINICAL DATA:  CVA.  Left-sided weakness. EXAM: BILATERAL CAROTID DUPLEX ULTRASOUND TECHNIQUE: Pearline Cables scale imaging, color Doppler and duplex ultrasound were performed of bilateral carotid and vertebral  arteries in the neck. COMPARISON:  Head CT 09/26/2016 FINDINGS: Criteria: Quantification of carotid stenosis is based on velocity parameters that correlate the residual internal carotid diameter with NASCET-based stenosis levels, using the diameter of the distal internal carotid lumen as the denominator for stenosis measurement. The following velocity measurements were obtained: RIGHT ICA:  91 cm/sec CCA:  93 cm/sec SYSTOLIC ICA/CCA RATIO:  1.0 DIASTOLIC ICA/CCA RATIO:  1.4 ECA:  84 cm/sec LEFT ICA:  79 cm/sec CCA:  83 cm/sec SYSTOLIC ICA/CCA RATIO:  1.0 DIASTOLIC ICA/CCA RATIO:  1.6 ECA:  79 cm/sec RIGHT CAROTID ARTERY: Right carotid arteries are patent without significant plaque or stenosis. Normal waveforms and velocities in the internal carotid artery. External carotid artery is patent with normal waveform. RIGHT VERTEBRAL ARTERY: Antegrade flow and normal waveform in the right vertebral artery. LEFT CAROTID ARTERY: Left carotid arteries are patent without significant plaque or stenosis. Normal waveforms and velocities in the internal carotid artery. External carotid artery is patent with normal waveform. LEFT VERTEBRAL ARTERY: Antegrade flow and normal waveform in the left vertebral artery. IMPRESSION: Normal carotid artery duplex examination. No significant plaque or stenosis in the carotid arteries. Patent vertebral arteries with antegrade flow. Electronically Signed   By: Markus Daft M.D.   On: 09/27/2016 10:20   Dg Chest Portable 1 View  Result Date: 09/26/2016 CLINICAL DATA:  Chest pain EXAM: PORTABLE CHEST 1 VIEW COMPARISON:  05/12/2016 FINDINGS: The heart size and mediastinal contours are within normal limits. Both lungs are clear. The visualized skeletal structures are unremarkable. IMPRESSION: No active disease. Electronically Signed   By: Donavan Foil M.D.   On: 09/26/2016 22:41   Mr Jodene Nam Head/brain PY Cm  Result Date: 09/27/2016 CLINICAL DATA:  51 year old female with onset of left side  weakness at 1900 hours the day of admission. Patient refused IV tPA. EXAM: MRI HEAD WITHOUT CONTRAST MRA HEAD WITHOUT CONTRAST TECHNIQUE: Multiplanar, multiecho pulse sequences of the brain and surrounding structures were obtained without intravenous contrast. Angiographic images of the head were obtained using MRA technique without contrast. COMPARISON:  Head CT without contrast 09/26/2016. Brain MRI and intracranial MRA 05/23/2015. FINDINGS: MRI HEAD FINDINGS Brain: Cerebral volume is normal. No restricted diffusion to suggest acute infarction. No midline shift, mass effect, evidence of mass lesion, ventriculomegaly, extra-axial collection or acute intracranial hemorrhage. Cervicomedullary junction and pituitary are within normal limits. Stable dystrophic calcification adjacent to the right frontal horn. No other areas of abnormal susceptibility in the brain. Pearline Cables and white matter signal is otherwise stable and normal for age. No encephalomalacia identified. Vascular: Major intracranial vascular flow voids are stable since 2017. Skull and upper cervical spine: Negative. Sinuses/Orbits: Stable an normal orbits soft tissues. Regressed maxillary sinus mucosal thickening. Other: Stable visible internal auditory structures. Stable mild left mastoid effusion. Negative nasopharynx. Scalp and face soft tissues appear negative. MRA HEAD FINDINGS Stable posterior circulation with antegrade flow in the distal vertebral arteries, the left is dominant B on the right PICA origin which remains patent. Patent left PICA origin. No distal vertebral artery or basilar stenosis. Normal SCA and PCA origins. Both posterior communicating arteries are present, the right is larger. Bilateral PCA branches are within normal limits. Stable antegrade flow in both ICA siphons. No siphon stenosis. Ophthalmic and posterior communicating artery origins appear stable and normal. Stable and normal  carotid termini, MCA and ACA origins. Stable and  normal visualized bilateral MCA branches. Stable anterior communicating artery which appears mildly ectatic. Visible ACA branches are stable and within normal limits. IMPRESSION: 1.  No acute intracranial abnormality. 2. Stable and unremarkable for age noncontrast MRI appearance of the brain. 3. Stable and normal intracranial MRA. Electronically Signed   By: Genevie Ann M.D.   On: 09/27/2016 11:20   Ct Head Code Stroke Wo Contrast  Result Date: 09/26/2016 CLINICAL DATA:  Code stroke. LEFT-sided weakness. History of stroke, tobacco use. EXAM: CT HEAD WITHOUT CONTRAST TECHNIQUE: Contiguous axial images were obtained from the base of the skull through the vertex without intravenous contrast. COMPARISON:  MRI of the head May 23, 2015 and CT HEAD May 22, 2015 FINDINGS: BRAIN: No intraparenchymal hemorrhage, mass effect nor midline shift. The ventricles and sulci are normal. No acute large vascular territory infarcts. RIGHT frontal intraparenchymal calcification is unchanged. Asymmetric RIGHT basal ganglia mineralization. No abnormal extra-axial fluid collections. Basal cisterns are patent. VASCULAR: Unremarkable. SKULL/SOFT TISSUES: No skull fracture. No significant soft tissue swelling. ORBITS/SINUSES: The included ocular globes and orbital contents are normal.The mastoid aircells and included paranasal sinuses are well-aerated. OTHER: None. ASPECTS [facility] Stroke Program Early CT Score) - Ganglionic level infarction (caudate, lentiform nuclei, internal capsule, insula, M1-M3 cortex): 7 - Supraganglionic infarction (M4-M6 cortex): 3 Total score (0-10 with 10 being normal): 10 IMPRESSION: 1. No acute intracranial process. 2. Nonspecific RIGHT frontal calcification, otherwise negative noncontrast CT HEAD. 3. ASPECTS is 10. 4. Critical Value/emergent results were called by telephone at the time of interpretation on 09/26/2016 at 10:02 pm to Dr. Fredia Sorrow , who verbally acknowledged these results. Electronically  Signed   By: Elon Alas M.D.   On: 09/26/2016 22:03    Echo:- Upper normal LV wall thickness with LVEF 60-65% and grossly   normal diastolic function. No obvious PFO or ASD.   Subjective: Feeling better today. Numbness and left-sided weakness is better today.  Discharge Exam: Vitals:   09/28/16 0510 09/28/16 1344  BP: 118/69 114/64  Pulse: 60 71  Resp: 18 16  Temp: 97.8 F (36.6 C) 98.5 F (36.9 C)  SpO2: 97% 99%   Vitals:   09/27/16 1454 09/27/16 1955 09/28/16 0510 09/28/16 1344  BP: 105/66 105/68 118/69 114/64  Pulse: 74 65 60 71  Resp: 18 18 18 16   Temp: 98.3 F (36.8 C) 97.9 F (36.6 C) 97.8 F (36.6 C) 98.5 F (36.9 C)  TempSrc: Oral Oral Oral Oral  SpO2: 99% 97% 97% 99%  Weight:      Height:        General: Pt is alert, awake, not in acute distress Cardiovascular: RRR, S1/S2 +, no rubs, no gallops Respiratory: CTA bilaterally, no wheezing, no rhonchi Abdominal: Soft, NT, ND, bowel sounds + Extremities: no edema, no cyanosis    The results of significant diagnostics from this hospitalization (including imaging, microbiology, ancillary and laboratory) are listed below for reference.     Microbiology: No results found for this or any previous visit (from the past 240 hour(s)).   Labs: BNP (last 3 results) No results for input(s): BNP in the last 8760 hours. Basic Metabolic Panel:  Recent Labs Lab 09/26/16 2148 09/26/16 2202  NA 138 140  K 3.4* 3.6  CL 103 105  CO2 22  --   GLUCOSE 101* 97  BUN 10 9  CREATININE 0.83 0.80  CALCIUM 9.0  --    Liver Function Tests:  Recent  Labs Lab 09/26/16 2148  AST 17  ALT 14  ALKPHOS 61  BILITOT 0.7  PROT 7.0  ALBUMIN 4.1   No results for input(s): LIPASE, AMYLASE in the last 168 hours. No results for input(s): AMMONIA in the last 168 hours. CBC:  Recent Labs Lab 09/26/16 2148 09/26/16 2202  WBC 8.2  --   NEUTROABS 5.7  --   HGB 14.3 13.9  HCT 39.7 41.0  MCV 87.1  --   PLT 257  --     Cardiac Enzymes:  Recent Labs Lab 09/27/16 0454 09/27/16 1119 09/27/16 1600  TROPONINI <0.03 <0.03 <0.03   BNP: Invalid input(s): POCBNP CBG:  Recent Labs Lab 09/26/16 2206  GLUCAP 103*   D-Dimer No results for input(s): DDIMER in the last 72 hours. Hgb A1c  Recent Labs  09/27/16 0454  HGBA1C 4.5*   Lipid Profile  Recent Labs  09/27/16 0454  CHOL 152  HDL 39*  LDLCALC 98  TRIG 77  CHOLHDL 3.9   Thyroid function studies No results for input(s): TSH, T4TOTAL, T3FREE, THYROIDAB in the last 72 hours.  Invalid input(s): FREET3 Anemia work up  Recent Labs  09/26/16 2154  VITAMINB12 216   Urinalysis    Component Value Date/Time   COLORURINE YELLOW 09/29/2015 [place] 09/29/2015 1316   APPEARANCEUR Clear 04/05/2015 1550   LABSPEC 1.010 09/29/2015 1316   PHURINE 7.0 09/29/2015 1316   GLUCOSEU NEGATIVE 09/29/2015 1316   HGBUR NEGATIVE 09/29/2015 1316   [place] 09/29/2015 1316   BILIRUBINUR Negative 04/05/2015 [place] 09/29/2015 1316   PROTEINUR NEGATIVE 09/29/2015 1316   UROBILINOGEN negative 02/01/2015 1649   NITRITE NEGATIVE 09/29/2015 1316   LEUKOCYTESUR TRACE (A) 09/29/2015 1316   LEUKOCYTESUR Negative 04/05/2015 1550   Sepsis Labs Invalid input(s): PROCALCITONIN,  WBC,  LACTICIDVEN Microbiology No results found for this or any previous visit (from the past 240 hour(s)).   Time coordinating discharge: Over 30 minutes  SIGNED:   Kathie Dike, MD  [facility] 09/28/2016, 6:50 PM Pager   If 7PM-7AM, please contact night-coverage [email or web address] Password [email or web address]

## 2016-09-29 ENCOUNTER — Telehealth: Payer: Self-pay

## 2016-09-29 NOTE — Telephone Encounter (Signed)
Patient calling to make apt for hospital follow up. Patient aware she is dismissed. Dismissal letter was sent 9/4. Please advise if patient can make hospital follow up apt with you or should with a new physician?  Has not found one yet.

## 2016-09-29 NOTE — Telephone Encounter (Signed)
Patient aware and verbalizes understanding. 

## 2016-09-29 NOTE — Telephone Encounter (Signed)
  Patient can be seen for urgent matters within 30 days, for hospital follow up with no new symps would recommend seeing a new provider.   Laroy Apple, MD [facility] 09/29/2016, 2:15 PM

## 2017-02-13 ENCOUNTER — Emergency Department (HOSPITAL_COMMUNITY): Payer: Self-pay

## 2017-02-13 ENCOUNTER — Observation Stay (HOSPITAL_COMMUNITY)
Admission: EM | Admit: 2017-02-13 | Discharge: 2017-02-13 | Disposition: A | Discharge status: Home / Self Care | Payer: Self-pay | Attending: Emergency Medicine | Admitting: Emergency Medicine

## 2017-02-13 ENCOUNTER — Other Ambulatory Visit: Payer: Self-pay

## 2017-02-13 ENCOUNTER — Encounter (HOSPITAL_COMMUNITY): Payer: Self-pay | Admitting: Emergency Medicine

## 2017-02-13 DIAGNOSIS — F17210 Nicotine dependence, cigarettes, uncomplicated: Secondary | ICD-10-CM | POA: Insufficient documentation

## 2017-02-13 DIAGNOSIS — R278 Other lack of coordination: Secondary | ICD-10-CM | POA: Insufficient documentation

## 2017-02-13 DIAGNOSIS — R531 Weakness: Secondary | ICD-10-CM | POA: Insufficient documentation

## 2017-02-13 DIAGNOSIS — J439 Emphysema, unspecified: Secondary | ICD-10-CM | POA: Insufficient documentation

## 2017-02-13 DIAGNOSIS — R2990 Unspecified symptoms and signs involving the nervous system: Secondary | ICD-10-CM | POA: Diagnosis present

## 2017-02-13 DIAGNOSIS — I69398 Other sequelae of cerebral infarction: Secondary | ICD-10-CM | POA: Insufficient documentation

## 2017-02-13 DIAGNOSIS — G8194 Hemiplegia, unspecified affecting left nondominant side: Principal | ICD-10-CM | POA: Insufficient documentation

## 2017-02-13 DIAGNOSIS — R200 Anesthesia of skin: Secondary | ICD-10-CM | POA: Insufficient documentation

## 2017-02-13 DIAGNOSIS — R29704 NIHSS score 4: Secondary | ICD-10-CM | POA: Insufficient documentation

## 2017-02-13 DIAGNOSIS — R29898 Other symptoms and signs involving the musculoskeletal system: Secondary | ICD-10-CM

## 2017-02-13 DIAGNOSIS — Z7982 Long term (current) use of aspirin: Secondary | ICD-10-CM | POA: Insufficient documentation

## 2017-02-13 LAB — APTT: aPTT: 31 seconds (ref 24–36)

## 2017-02-13 LAB — COMPREHENSIVE METABOLIC PANEL
ALK PHOS: 73 U/L (ref 38–126)
ALT: 19 U/L (ref 14–54)
AST: 20 U/L (ref 15–41)
Albumin: 3.9 g/dL (ref 3.5–5.0)
Anion gap: 10 (ref 5–15)
BUN: 11 mg/dL (ref 6–20)
CALCIUM: 9.1 mg/dL (ref 8.9–10.3)
CO2: 24 mmol/L (ref 22–32)
CREATININE: 0.8 mg/dL (ref 0.44–1.00)
Chloride: 103 mmol/L (ref 101–111)
Glucose, Bld: 90 mg/dL (ref 65–99)
Potassium: 3.9 mmol/L (ref 3.5–5.1)
Sodium: 137 mmol/L (ref 135–145)
Total Bilirubin: 0.8 mg/dL (ref 0.3–1.2)
Total Protein: 7 g/dL (ref 6.5–8.1)

## 2017-02-13 LAB — CBC
HCT: 42.2 % (ref 36.0–46.0)
HEMOGLOBIN: 14.4 g/dL (ref 12.0–15.0)
MCH: 30.6 pg (ref 26.0–34.0)
MCHC: 34.1 g/dL (ref 30.0–36.0)
MCV: 89.6 fL (ref 78.0–100.0)
Platelets: 236 10*3/uL (ref 150–400)
RBC: 4.71 MIL/uL (ref 3.87–5.11)
RDW: 13.8 % (ref 11.5–15.5)
WBC: 6.2 10*3/uL (ref 4.0–10.5)

## 2017-02-13 LAB — PROTIME-INR
INR: 1.01
Prothrombin Time: 13.2 seconds (ref 11.4–15.2)

## 2017-02-13 LAB — DIFFERENTIAL
BASOS ABS: 0 10*3/uL (ref 0.0–0.1)
Basophils Relative: 1 %
Eosinophils Absolute: 0.3 10*3/uL (ref 0.0–0.7)
Eosinophils Relative: 4 %
LYMPHS PCT: 32 %
Lymphs Abs: 2 10*3/uL (ref 0.7–4.0)
MONO ABS: 0.4 10*3/uL (ref 0.1–1.0)
Monocytes Relative: 6 %
NEUTROS ABS: 3.6 10*3/uL (ref 1.7–7.7)
Neutrophils Relative %: 57 %

## 2017-02-13 LAB — I-STAT CHEM 8, ED
BUN: 9 mg/dL (ref 6–20)
CALCIUM ION: 1.19 mmol/L (ref 1.15–1.40)
CHLORIDE: 105 mmol/L (ref 101–111)
CREATININE: 0.8 mg/dL (ref 0.44–1.00)
GLUCOSE: 81 mg/dL (ref 65–99)
HCT: 41 % (ref 36.0–46.0)
Hemoglobin: 13.9 g/dL (ref 12.0–15.0)
Potassium: 4.1 mmol/L (ref 3.5–5.1)
Sodium: 141 mmol/L (ref 135–145)
TCO2: 27 mmol/L (ref 22–32)

## 2017-02-13 LAB — I-STAT TROPONIN, ED: TROPONIN I, POC: 0 ng/mL (ref 0.00–0.08)

## 2017-02-13 LAB — URINALYSIS, ROUTINE W REFLEX MICROSCOPIC
BILIRUBIN URINE: NEGATIVE
Bacteria, UA: NONE SEEN
GLUCOSE, UA: NEGATIVE mg/dL
HGB URINE DIPSTICK: NEGATIVE
Ketones, ur: NEGATIVE mg/dL
NITRITE: NEGATIVE
PH: 6 (ref 5.0–8.0)
Protein, ur: NEGATIVE mg/dL
SPECIFIC GRAVITY, URINE: 1.009 (ref 1.005–1.030)

## 2017-02-13 LAB — RAPID URINE DRUG SCREEN, HOSP PERFORMED
AMPHETAMINES: NOT DETECTED
BARBITURATES: NOT DETECTED
BENZODIAZEPINES: NOT DETECTED
Cocaine: NOT DETECTED
Opiates: POSITIVE — AB
Tetrahydrocannabinol: NOT DETECTED

## 2017-02-13 LAB — I-STAT BETA HCG BLOOD, ED (MC, WL, AP ONLY)

## 2017-02-13 LAB — ETHANOL: Alcohol, Ethyl (B): <10 mg/dL (ref <10)

## 2017-02-13 NOTE — Consult Note (Signed)
[facility] Tele[facility]urology Consult Services   Asked to see this patient in telemedicine consultation. Consultation was performed with assistance of ancillary / medical staff at bedside. ?   Verbal consent to perform the examination with telemedicine was obtained. Patient and family agreed to proceed with the consultation.   Impression: L Hemiparesis  L side numbness/weakness  Similar pattern to recent stroke in Sept 2018  Unsure if new event v reactivation  NIHSS 4  Last normal going to bed at 2100 yesterday  Not a tpa candidate due to:  time  Not an NIR candidate due to:  Not a typical LVO pattern of symptoms  Differential Diagnosis:   Stroke  TIA  Seizure  Migraine   Metrics:  Arrival time:  1126  [facility] contacted:  1215  [facility] at bedside:  1219 (telephone call requested by Dr Laverta [place])  NIHSS assessment time:  1228  Recommendations:    Antiplatelet therapy with ASA  DVT Prophylaxis  Dysphagia Screen  Inpatient diagnostic testing to consider includes:  MRI brain  Inpatient neurology consultation  Discussed with ED MD     CC:  Stroke Alert  Date of Consult:  02/13/17    HPI:  Recent stroke in Sept 2018, main residual issues regarding balance  MRI reviewed with no acute stroke from that time  To bed at 2100 normal/baseline  Up at approximately 240 noting LUE pain and numbness  Some mild weakness as well  Felt a 'drawing' on left side of face and some slurred speech  To ER this AM for further eval  NIHSS 4, mild LU/LE weakness, dec sensation, and mild dysarthria  No tPA due to time  Pattern of symptoms, if stroke, more consistent with small vessel process so no intervention   Past Medical History:  Diagnosis Date  . Anxiety   . Chronic headache   . Depression   . Emphysema of lung ([place])   . Gastritis   . GERD (gastroesophageal reflux disease)   . Polyp of colon 04/2015   benign  . Stroke [place])     STATED 2 LIGHT STROKES JUNE 21ST 2017  . Tobacco abuse     Prior to Admission medications   Medication Sig Start Date End Date Taking? Authorizing Provider  aspirin 81 MG chewable tablet Chew 81 mg by mouth 2 (two) times daily.     [provider]     Allergies  Allergen Reactions  . Aleve [Naproxen] Hives and Itching  . Asa [Aspirin] Hives and Itching  . Excedrin [Aspirin-Acetaminophen-Caffeine] Hives and Itching  . Morphine And Related Itching  . Vicodin [Hydrocodone-Acetaminophen] Hives and Itching    Pt states she can take acetaminophen with no problems    Social History   Tobacco Use  . Smoking status: Current Every Day Smoker    Packs/day: 1.00    Years: 37.00    Pack years: 37.00    Types: Cigarettes  . Smokeless tobacco: Never Used  Substance Use Topics  . Alcohol use: No    Alcohol/week: 0.0 oz  . Drug use: No    Family History  Problem Relation Age of Onset  . Heart failure Mother   . Colon cancer Mother 67  . Bladder Cancer Mother   . CAD Mother 76       CABG x 4  . Heart disease Father   . Breast cancer Sister   . Irregular heart beat Sister   . Heart disease Maternal Aunt   . Heart  disease Maternal Uncle   . Colon cancer Paternal Aunt 54  . Bipolar disorder Son         Physical Exam  Vitals:   02/13/17 1152  Pulse: 64  Resp: 18  Temp: 98.1 F (36.7 C)  SpO2: 98%     NIHSS  LOC - 0  LOC questions - 0  LOC commands - 0  EOM - 0  VF - 0  Face - 00  Motor Upper Ext - 1  Motor Lower Ext - 1  Coordination - 0  Sensory - 1  Language - 0  Speech - 1  Extinction - 0  NIHSS total - 4   Lab/Data Review  CT Head - reviewed - no acute process      Medical Decision Making:  - Extensive number of diagnosis or management options are considered above.   - Extensive amount of complex data reviewed.   - High risk of complication and/or morbidity or mortality are associated with differential diagnostic  considerations above.  - There may be Uncertain outcome and increased probability of prolonged functional impairment or high probability of severe prolonged functional impairment associated with some of these differential diagnosis.  Medical Data Reviewed:  1.Data reviewed include clinical labs, radiology,  Medical Tests;   2.Tests results discussed w/performing or interpreting physician;   3.Obtaining/reviewing old medical records;  4.Obtaining case history from another source;  5.Independent review of image, tracing or specimen.
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
## 2017-02-13 NOTE — Discharge Instructions (Addendum)
You were seen in the ED today with weakness and numbness. Your MRI today did not show any sign of a stroke and I believe that you are safe to return home. You will need to call the Neurologist listed to continue evaluation in the clinic ASAP to continue the evaluation and attempt to find a cause of your symptoms. You will need to call and make an appointment. Take Tylenol and Motrin at home as needed for pain.

## 2017-02-13 NOTE — ED Triage Notes (Signed)
Pt states that she woke up this am at 0230 with left arm numbness with pain.  CP started at same time.  Previous HX of stroke. LKW was 0230 this am. Decreased sensation on left side.

## 2017-03-03 ENCOUNTER — Emergency Department (HOSPITAL_COMMUNITY)
Admission: EM | Admit: 2017-03-03 | Discharge: 2017-03-03 | Disposition: A | Discharge status: Home / Self Care | Payer: Self-pay | Attending: Emergency Medicine | Admitting: Emergency Medicine

## 2017-03-03 ENCOUNTER — Emergency Department (HOSPITAL_COMMUNITY): Payer: Self-pay

## 2017-03-03 ENCOUNTER — Encounter (HOSPITAL_COMMUNITY): Payer: Self-pay

## 2017-03-03 DIAGNOSIS — Z7982 Long term (current) use of aspirin: Secondary | ICD-10-CM | POA: Insufficient documentation

## 2017-03-03 DIAGNOSIS — R112 Nausea with vomiting, unspecified: Secondary | ICD-10-CM

## 2017-03-03 DIAGNOSIS — F17210 Nicotine dependence, cigarettes, uncomplicated: Secondary | ICD-10-CM | POA: Insufficient documentation

## 2017-03-03 DIAGNOSIS — R0789 Other chest pain: Secondary | ICD-10-CM | POA: Insufficient documentation

## 2017-03-03 DIAGNOSIS — R1111 Vomiting without nausea: Secondary | ICD-10-CM | POA: Insufficient documentation

## 2017-03-03 HISTORY — DX: Personal history of other medical treatment: Z92.89

## 2017-03-03 LAB — COMPREHENSIVE METABOLIC PANEL
ALT: 20 U/L (ref 14–54)
ANION GAP: 9 (ref 5–15)
AST: 20 U/L (ref 15–41)
Albumin: 3.5 g/dL (ref 3.5–5.0)
Alkaline Phosphatase: 69 U/L (ref 38–126)
BILIRUBIN TOTAL: 0.3 mg/dL (ref 0.3–1.2)
BUN: 13 mg/dL (ref 6–20)
CHLORIDE: 108 mmol/L (ref 101–111)
CO2: 23 mmol/L (ref 22–32)
Calcium: 8.8 mg/dL — ABNORMAL LOW (ref 8.9–10.3)
Creatinine, Ser: 0.81 mg/dL (ref 0.44–1.00)
Glucose, Bld: 86 mg/dL (ref 65–99)
POTASSIUM: 4.3 mmol/L (ref 3.5–5.1)
Sodium: 140 mmol/L (ref 135–145)
TOTAL PROTEIN: 6.5 g/dL (ref 6.5–8.1)

## 2017-03-03 LAB — CBC WITH DIFFERENTIAL/PLATELET
BASOS PCT: 1 %
Basophils Absolute: 0 10*3/uL (ref 0.0–0.1)
EOS ABS: 0.2 10*3/uL (ref 0.0–0.7)
Eosinophils Relative: 3 %
HEMATOCRIT: 41.1 % (ref 36.0–46.0)
HEMOGLOBIN: 13.8 g/dL (ref 12.0–15.0)
Lymphocytes Relative: 27 %
Lymphs Abs: 1.8 10*3/uL (ref 0.7–4.0)
MCH: 30.3 pg (ref 26.0–34.0)
MCHC: 33.6 g/dL (ref 30.0–36.0)
MCV: 90.1 fL (ref 78.0–100.0)
Monocytes Absolute: 0.1 10*3/uL (ref 0.1–1.0)
Monocytes Relative: 1 %
NEUTROS ABS: 4.7 10*3/uL (ref 1.7–7.7)
NEUTROS PCT: 68 %
Platelets: 253 10*3/uL (ref 150–400)
RBC: 4.56 MIL/uL (ref 3.87–5.11)
RDW: 13.9 % (ref 11.5–15.5)
WBC: 6.8 10*3/uL (ref 4.0–10.5)

## 2017-03-03 LAB — LIPASE, BLOOD: LIPASE: 35 U/L (ref 11–51)

## 2017-03-03 LAB — TROPONIN I

## 2017-03-03 MED ORDER — ACETAMINOPHEN 325 MG PO TABS
650.0000 mg | ORAL_TABLET | Freq: Once | ORAL | Status: AC
  Administered 2017-03-03: 650 mg via ORAL
  Filled 2017-03-03: qty 2

## 2017-03-03 MED ORDER — GI COCKTAIL ~~LOC~~
30.0000 mL | Freq: Once | ORAL | Status: AC
  Administered 2017-03-03: 30 mL via ORAL
  Filled 2017-03-03: qty 30

## 2017-03-03 MED ORDER — ONDANSETRON 4 MG PO TBDP: 4.0000 mg | ORAL_TABLET | Freq: Three times a day (TID) | ORAL | 0 refills | Status: DC | PRN

## 2017-03-03 MED ORDER — ONDANSETRON HCL 4 MG/2ML IJ SOLN
4.0000 mg | INTRAMUSCULAR | Status: DC | PRN
  Administered 2017-03-03: 4 mg via INTRAVENOUS
  Filled 2017-03-03: qty 2

## 2017-03-03 MED ORDER — FAMOTIDINE IN NACL 20-0.9 MG/50ML-% IV SOLN
20.0000 mg | Freq: Once | INTRAVENOUS | Status: AC
Start: 2017-03-03 — End: 2017-03-03
  Administered 2017-03-03: 20 mg via INTRAVENOUS
  Filled 2017-03-03: qty 50

## 2017-03-03 MED ORDER — FAMOTIDINE 20 MG PO TABS: 20.0000 mg | ORAL_TABLET | Freq: Two times a day (BID) | ORAL | 0 refills | Status: DC

## 2017-03-03 NOTE — ED Notes (Signed)
Pt drank Sprite without difficulty.  No vomiting.  Continues to c/o chest pain.

## 2017-03-03 NOTE — ED Triage Notes (Signed)
Pt woke up vomiting around 0600 then started having left sided chest pain.  Reports pain is in left arm and left leg as well.  PT took 486mg  asa pta and 5 sprays of nitro.  initially the sprays helped but then no relief.  NSR on monitor per ems.
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
## 2017-03-03 NOTE — Discharge Instructions (Signed)
Take the prescriptions as directed.  Take over the counter tylenol, as directed on packaging, as needed for discomfort. Increase your fluid intake (ie:  Gatoraide) for the next few days.  Eat a bland diet and advance to your regular diet slowly as you can tolerate it. Apply moist heat or ice to the area(s) of discomfort, for 15 minutes at a time, several times per day for the next few days.  Do not fall asleep on a heating or ice pack.  Call your regular medical doctor tomorrow to schedule a follow up appointment this week.  Return to the Emergency Department immediately if worsening.

## 2017-03-03 NOTE — ED Notes (Signed)
Sprite given 

## 2017-05-30 ENCOUNTER — Emergency Department (HOSPITAL_COMMUNITY)
Admission: EM | Admit: 2017-05-30 | Discharge: 2017-05-30 | Disposition: A | Discharge status: Home / Self Care | Payer: Self-pay | Attending: Emergency Medicine | Admitting: Emergency Medicine

## 2017-05-30 ENCOUNTER — Other Ambulatory Visit: Payer: Self-pay

## 2017-05-30 ENCOUNTER — Encounter (HOSPITAL_COMMUNITY): Payer: Self-pay | Admitting: Emergency Medicine

## 2017-05-30 DIAGNOSIS — F17210 Nicotine dependence, cigarettes, uncomplicated: Secondary | ICD-10-CM | POA: Insufficient documentation

## 2017-05-30 DIAGNOSIS — Z7982 Long term (current) use of aspirin: Secondary | ICD-10-CM | POA: Insufficient documentation

## 2017-05-30 DIAGNOSIS — G43901 Migraine, unspecified, not intractable, with status migrainosus: Secondary | ICD-10-CM | POA: Insufficient documentation

## 2017-05-30 DIAGNOSIS — Z79899 Other long term (current) drug therapy: Secondary | ICD-10-CM | POA: Insufficient documentation

## 2017-05-30 DIAGNOSIS — I69954 Hemiplegia and hemiparesis following unspecified cerebrovascular disease affecting left non-dominant side: Secondary | ICD-10-CM | POA: Insufficient documentation

## 2017-05-30 MED ORDER — DIPHENHYDRAMINE HCL 50 MG/ML IJ SOLN
25.0000 mg | Freq: Once | INTRAMUSCULAR | Status: AC
  Administered 2017-05-30: 25 mg via INTRAVENOUS
  Filled 2017-05-30: qty 1

## 2017-05-30 MED ORDER — METOCLOPRAMIDE HCL 5 MG/ML IJ SOLN
10.0000 mg | Freq: Once | INTRAMUSCULAR | Status: AC
  Administered 2017-05-30: 10 mg via INTRAVENOUS
  Filled 2017-05-30: qty 2

## 2017-05-30 MED ORDER — KETOROLAC TROMETHAMINE 15 MG/ML IJ SOLN
15.0000 mg | Freq: Once | INTRAMUSCULAR | Status: AC
  Administered 2017-05-30: 15 mg via INTRAVENOUS
  Filled 2017-05-30: qty 1

## 2017-05-30 MED ORDER — DEXAMETHASONE SODIUM PHOSPHATE 10 MG/ML IJ SOLN
10.0000 mg | Freq: Once | INTRAMUSCULAR | Status: AC
  Administered 2017-05-30: 10 mg via INTRAVENOUS
  Filled 2017-05-30: qty 1

## 2017-05-30 MED ORDER — ACETAMINOPHEN 325 MG PO TABS
650.0000 mg | ORAL_TABLET | Freq: Once | ORAL | Status: AC
  Administered 2017-05-30: 650 mg via ORAL
  Filled 2017-05-30: qty 2

## 2017-05-30 NOTE — ED Triage Notes (Addendum)
Pt c/o migraine HA that is causing her chest to hurt since Tuesday. Pt also reports light sensitivity, dizziness, and n/v. Pt with known hx of migraines. Took Motrin with no relief.
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
## 2017-05-30 NOTE — ED Notes (Signed)
Pt not in the room, angio cath is lying on the table.  EDP notified.

## 2018-05-11 ENCOUNTER — Other Ambulatory Visit (HOSPITAL_COMMUNITY): Payer: Self-pay | Admitting: Internal Medicine

## 2018-05-11 DIAGNOSIS — R1031 Right lower quadrant pain: Secondary | ICD-10-CM

## 2018-05-11 DIAGNOSIS — R195 Other fecal abnormalities: Secondary | ICD-10-CM

## 2018-05-11 DIAGNOSIS — K921 Melena: Secondary | ICD-10-CM

## 2018-05-11 DIAGNOSIS — R1011 Right upper quadrant pain: Secondary | ICD-10-CM

## 2018-05-12 ENCOUNTER — Other Ambulatory Visit: Payer: Self-pay

## 2018-05-12 ENCOUNTER — Ambulatory Visit (HOSPITAL_COMMUNITY)
Admission: RE | Admit: 2018-05-12 | Discharge: 2018-05-12 | Disposition: A | Discharge status: Home / Self Care | Payer: Medicare Other | Source: Ambulatory Visit | Attending: Internal Medicine | Admitting: Internal Medicine

## 2018-05-12 DIAGNOSIS — K921 Melena: Secondary | ICD-10-CM | POA: Diagnosis present

## 2018-05-12 DIAGNOSIS — R1011 Right upper quadrant pain: Secondary | ICD-10-CM | POA: Diagnosis present

## 2018-05-12 DIAGNOSIS — R1031 Right lower quadrant pain: Secondary | ICD-10-CM | POA: Insufficient documentation

## 2018-05-12 DIAGNOSIS — R195 Other fecal abnormalities: Secondary | ICD-10-CM | POA: Diagnosis present

## 2018-05-12 MED ORDER — IOHEXOL 300 MG/ML  SOLN: 30.0000 mL | Freq: Once | INTRAMUSCULAR | Status: DC | PRN

## 2018-05-12 MED ORDER — IOHEXOL 300 MG/ML  SOLN
100.0000 mL | Freq: Once | INTRAMUSCULAR | Status: AC | PRN
  Administered 2018-05-12: 13:00:00 100 mL via INTRAVENOUS

## 2018-09-18 IMAGING — CT CT HEAD W/O CM
3 series · 16 of 47 positions shown, 19 images · non-contrast
Comparison: Head CT dated 09/26/2016 and brain MR dated 09/27/2016.

CLINICAL DATA: Woke up this morning at [DATE] with left arm pain and
numbness. Chest pain. Decreased sensation on the left.

EXAM:
CT HEAD WITHOUT CONTRAST
TECHNIQUE: Contiguous axial images were obtained from the base of the skull
through the vertex without intravenous contrast.

[Series 2: head wo · axial · 0.39mm/px · z∈[-37,+88]mm · 10 of 31 slices shown, 13 images]
[im 3/31  brain]
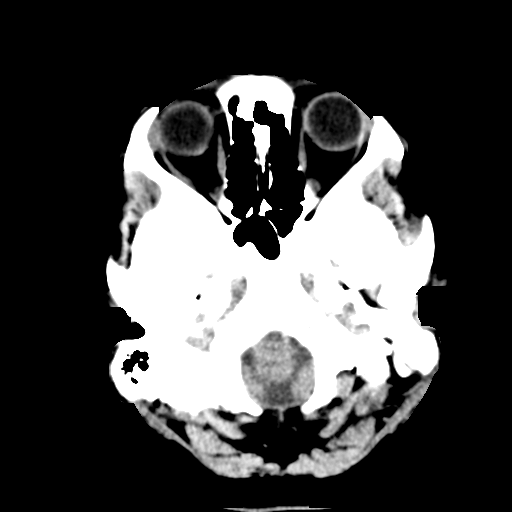
[im 3/31  bone]
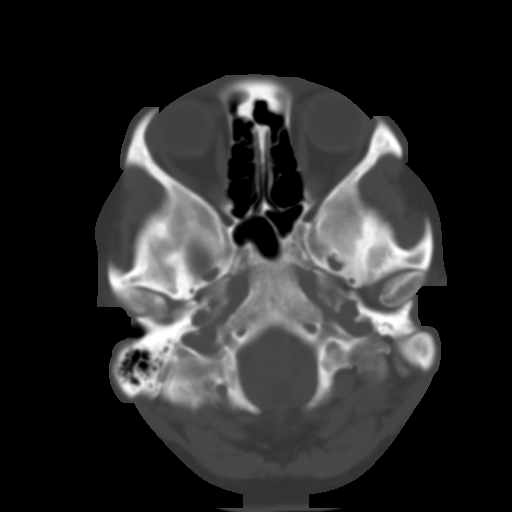
[im 6/31  brain]
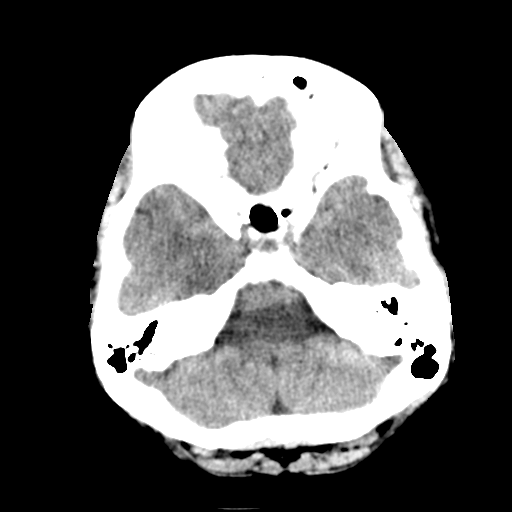
[im 9/31  brain]
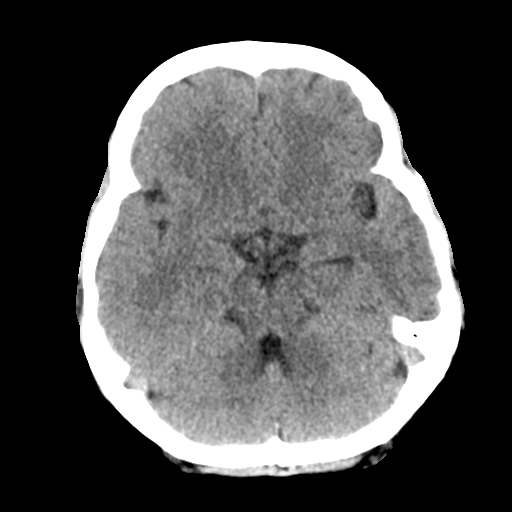
[im 11/31  brain]
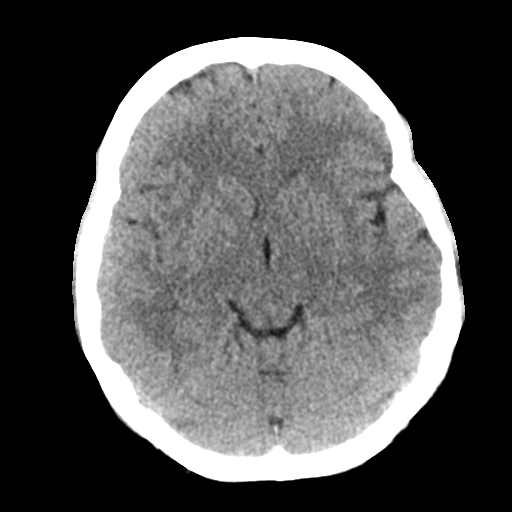
[im 14/31  brain]
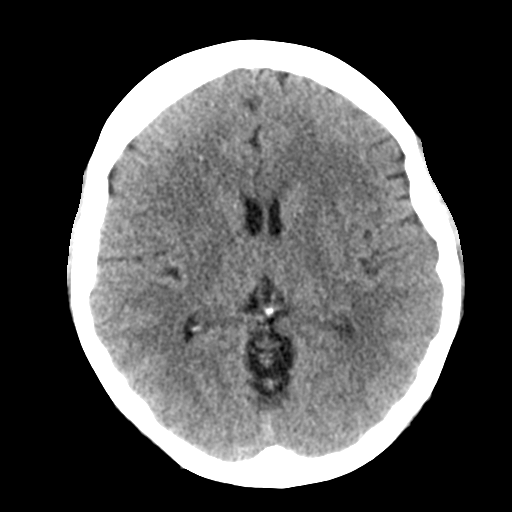
[im 14/31  bone]
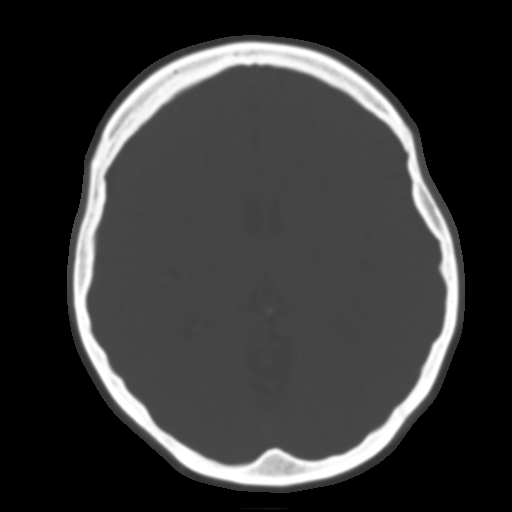
[im 17/31  brain]
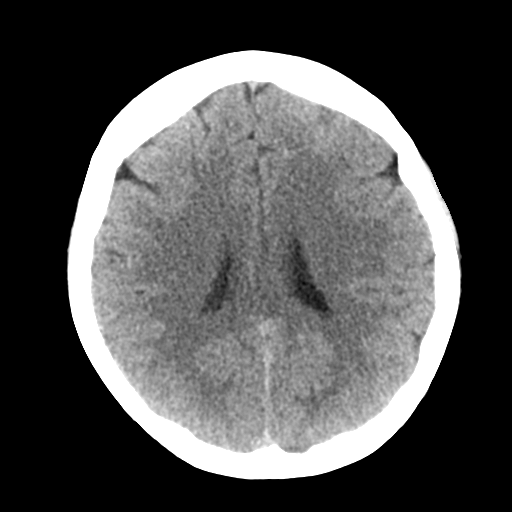
[im 20/31  brain]
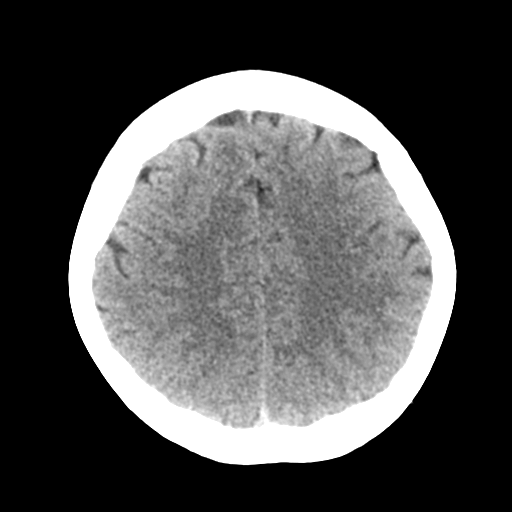
[im 23/31  brain]
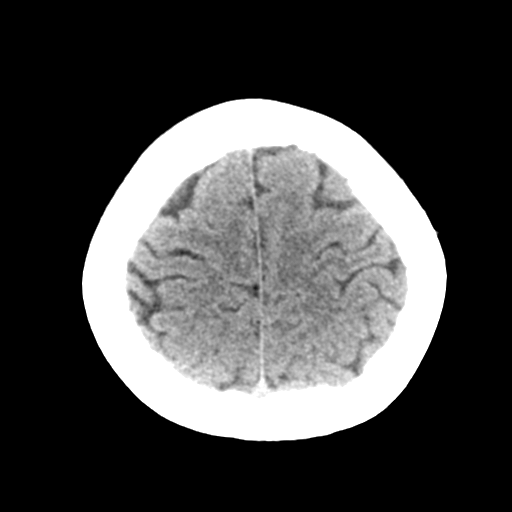
[im 25/31  brain]
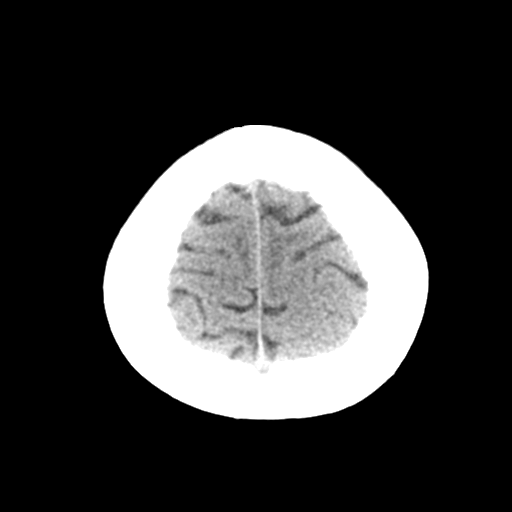
[im 25/31  bone]
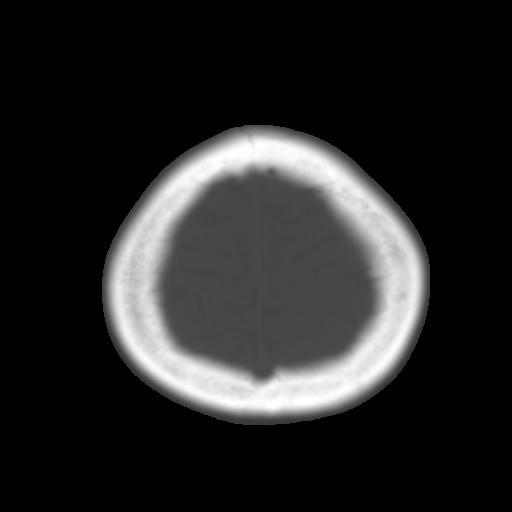
[im 28/31  brain]
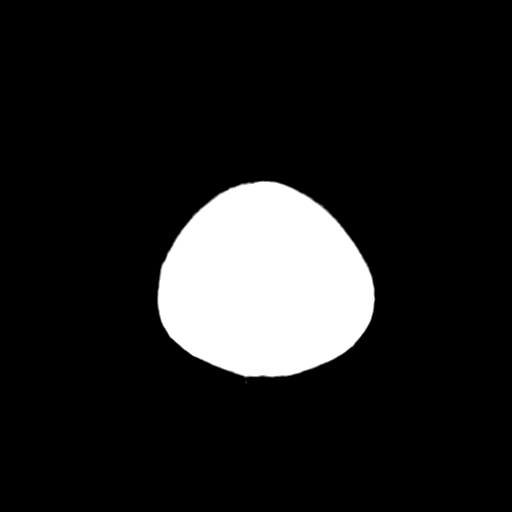

[Series 4: coronal soft tissue · coronal · 0.37mm/px · 3 of 58 slices shown]
[im 20/58  brain]
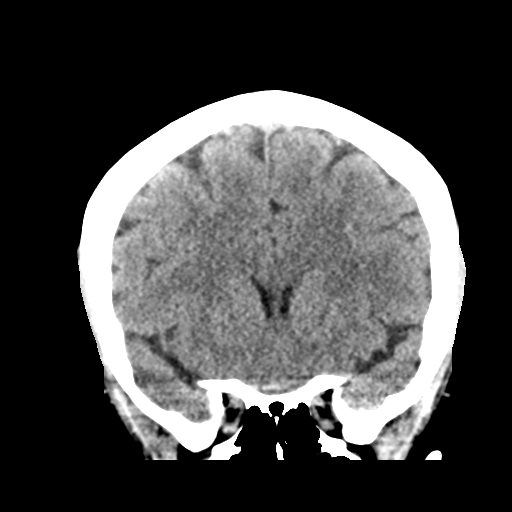
[im 26/58  brain]
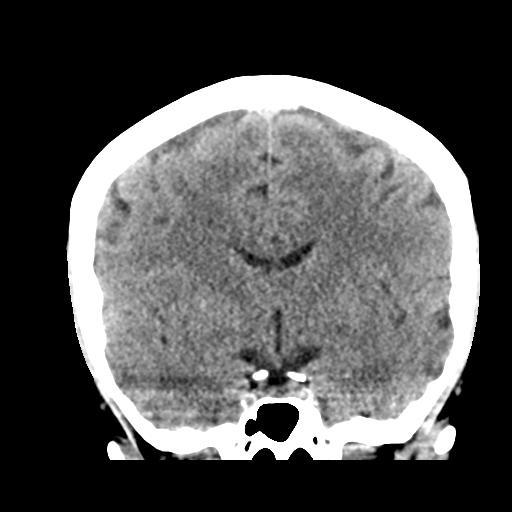
[im 32/58  brain]
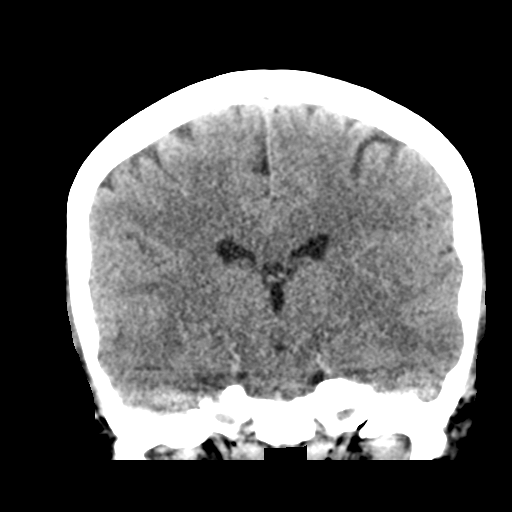

[Series 5: sagittal soft tissue · sagittal · 0.34mm/px · 3 of 53 slices shown]
[im 18/53  brain]
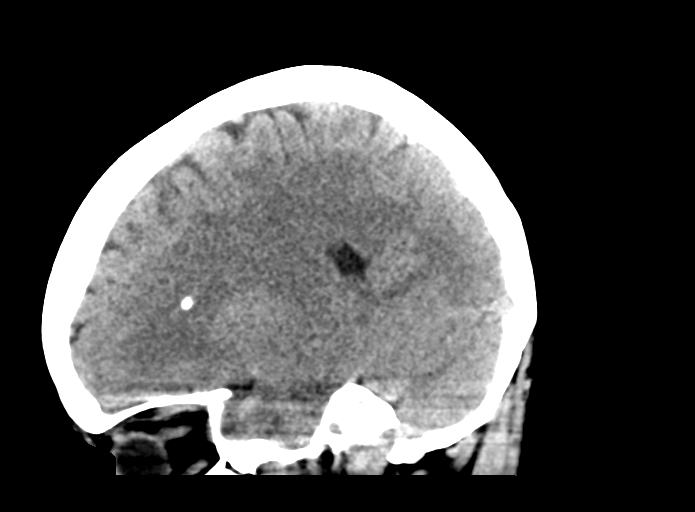
[im 27/53  brain]
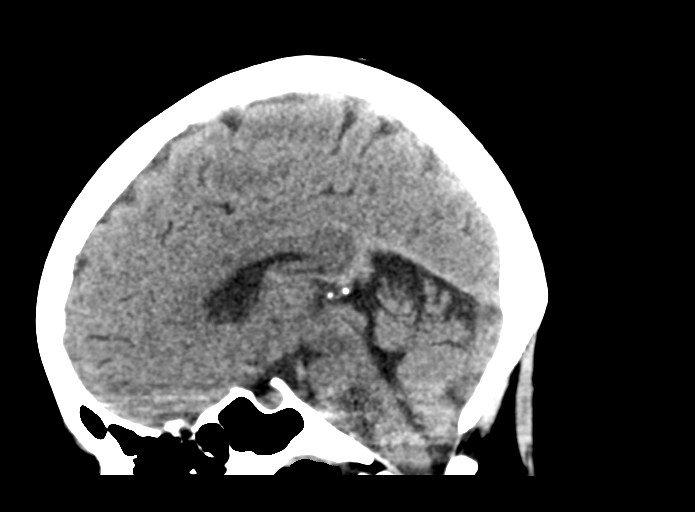
[im 35/53  brain]
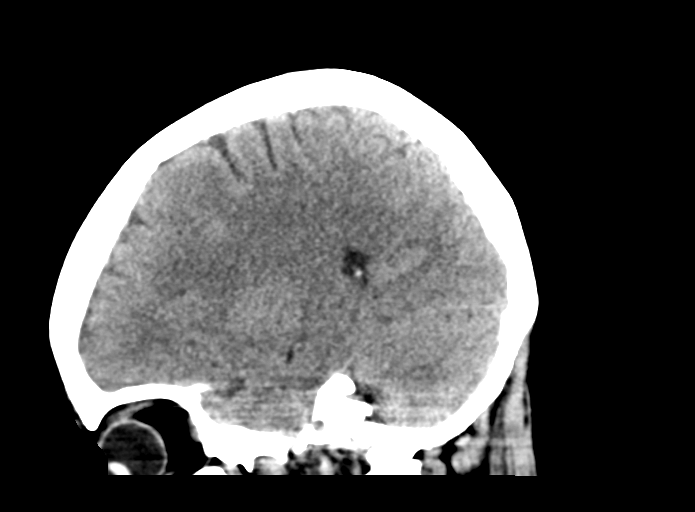

[16 of 47 positions shown; findings below may reference images not displayed]

FINDINGS: Brain: Stable right frontal lobe white matter dystrophic
calcification. Normal size and position of the ventricles. No
intracranial hemorrhage, mass lesion or CT evidence of acute
infarction.

Vascular: No hyperdense vessel or unexpected calcification.

Skull: Normal. Negative for fracture or focal lesion.

Sinuses/Orbits: Unremarkable.

Other: None.
IMPRESSION: No acute abnormality.

## 2018-11-05 ENCOUNTER — Ambulatory Visit (INDEPENDENT_AMBULATORY_CARE_PROVIDER_SITE_OTHER): Payer: Medicare Other | Admitting: Gastroenterology

## 2018-11-05 ENCOUNTER — Encounter: Payer: Self-pay | Admitting: Gastroenterology

## 2018-11-05 ENCOUNTER — Other Ambulatory Visit: Payer: Self-pay

## 2018-11-05 VITALS — BP 140/80 | HR 68 | Temp 97.9°F | Ht 65.0 in | Wt 194.0 lb

## 2018-11-05 DIAGNOSIS — K625 Hemorrhage of anus and rectum: Secondary | ICD-10-CM | POA: Diagnosis not present

## 2018-11-05 DIAGNOSIS — Z1159 Encounter for screening for other viral diseases: Secondary | ICD-10-CM | POA: Insufficient documentation

## 2018-11-05 DIAGNOSIS — Z86010 Personal history of colonic polyps: Secondary | ICD-10-CM

## 2018-11-05 MED ORDER — GOLYTELY 236 G PO SOLR: 4000.0000 mL | Freq: Once | ORAL | 0 refills | Status: AC
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
## 2018-11-05 NOTE — Patient Instructions (Signed)
Discontinue Goody powders.   Take the dicyclomine/Bentyl you have at home for abdominal pain.  You have been scheduled for a colonoscopy. Please follow written instructions given to you at your visit today.  Please pick up your prep supplies at the pharmacy within the next 1-3 days. If you use inhalers (even only as needed), please bring them with you on the day of your procedure.

## 2018-11-08 NOTE — Progress Notes (Signed)
Addendum: Reviewed and agree with assessment and management plan. Parvin Stetzer M, MD  

## 2018-11-15 ENCOUNTER — Telehealth: Payer: Self-pay | Admitting: Gastroenterology

## 2018-11-15 NOTE — Telephone Encounter (Signed)
Either of those is fine as long as the instructions would be similar to what you already gave her.

## 2018-11-15 NOTE — Telephone Encounter (Signed)
Heather [name],  The pharmacy does not have Golytely available any longer. Can we use Trilyte or Nulytley?

## 2018-11-15 NOTE — Telephone Encounter (Signed)
[facility] is calling because the go lightly prep that was called in for patient is not avaiable- they have looked and can not find it anywhere. Stated that they will be getting new-lightly tomorrow and what they have on hand right now is Trilyte.

## 2018-11-16 NOTE — Telephone Encounter (Signed)
The pharmacist states the Heather [name] is back order but they have generic Nulytley in stock. The pharmacist states it looks the same and it is the same prep for patient. He also stated they have not heard from the patient since the prescription was sent in. Informed pharmacist I will try contacting the patient to let her know of the changes.

## 2018-11-16 NOTE — Telephone Encounter (Signed)
Informed patient of the changes to the prep but to contact me if she has any questions regarding instructions. Also informed patient that the pharmacist was concerned since she has not picked up the prep. Patient states she will pick it up tomorrow.

## 2018-11-25 ENCOUNTER — Telehealth: Payer: Self-pay | Admitting: *Deleted

## 2018-11-25 NOTE — Telephone Encounter (Signed)
No return call received from pt.

## 2018-11-25 NOTE — Telephone Encounter (Signed)
Message left with call back number.

## 2018-11-26 ENCOUNTER — Encounter: Payer: Medicare Other | Admitting: Internal Medicine

## 2019-04-20 ENCOUNTER — Other Ambulatory Visit (HOSPITAL_COMMUNITY): Payer: Self-pay | Admitting: Internal Medicine

## 2019-04-20 ENCOUNTER — Other Ambulatory Visit: Payer: Self-pay | Admitting: Internal Medicine

## 2019-04-20 DIAGNOSIS — R600 Localized edema: Secondary | ICD-10-CM

## 2019-04-21 ENCOUNTER — Other Ambulatory Visit: Payer: Self-pay

## 2019-04-21 ENCOUNTER — Ambulatory Visit (HOSPITAL_COMMUNITY)
Admission: RE | Admit: 2019-04-21 | Discharge: 2019-04-21 | Disposition: A | Discharge status: Home / Self Care | Payer: Medicare Other | Source: Ambulatory Visit | Attending: Internal Medicine | Admitting: Internal Medicine

## 2019-04-21 DIAGNOSIS — R600 Localized edema: Secondary | ICD-10-CM | POA: Diagnosis not present

## 2019-04-25 ENCOUNTER — Ambulatory Visit (HOSPITAL_COMMUNITY): Payer: Medicare Other

## 2019-07-07 ENCOUNTER — Other Ambulatory Visit: Payer: Self-pay

## 2019-07-07 ENCOUNTER — Other Ambulatory Visit (HOSPITAL_COMMUNITY): Payer: Self-pay | Admitting: Internal Medicine

## 2019-07-07 ENCOUNTER — Ambulatory Visit (HOSPITAL_COMMUNITY)
Admission: RE | Admit: 2019-07-07 | Discharge: 2019-07-07 | Disposition: A | Discharge status: Home / Self Care | Payer: Medicare Other | Source: Ambulatory Visit | Attending: Internal Medicine | Admitting: Internal Medicine

## 2019-07-07 DIAGNOSIS — S46912A Strain of unspecified muscle, fascia and tendon at shoulder and upper arm level, left arm, initial encounter: Secondary | ICD-10-CM | POA: Diagnosis present

## 2019-07-07 DIAGNOSIS — S46911A Strain of unspecified muscle, fascia and tendon at shoulder and upper arm level, right arm, initial encounter: Secondary | ICD-10-CM

## 2020-08-28 ENCOUNTER — Ambulatory Visit (INDEPENDENT_AMBULATORY_CARE_PROVIDER_SITE_OTHER): Payer: Medicare Other | Admitting: Orthopedic Surgery

## 2020-08-28 ENCOUNTER — Encounter: Payer: Self-pay | Admitting: Orthopedic Surgery

## 2020-08-28 ENCOUNTER — Other Ambulatory Visit: Payer: Self-pay

## 2020-08-28 VITALS — BP 151/99 | HR 94 | Ht 65.0 in | Wt 210.0 lb

## 2020-08-28 DIAGNOSIS — G5603 Carpal tunnel syndrome, bilateral upper limbs: Secondary | ICD-10-CM

## 2020-08-28 MED ORDER — GABAPENTIN 100 MG PO CAPS: 100.0000 mg | ORAL_CAPSULE | Freq: Three times a day (TID) | ORAL | 0 refills | Status: DC
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
## 2020-10-29 ENCOUNTER — Other Ambulatory Visit: Payer: Self-pay

## 2020-10-29 ENCOUNTER — Encounter: Payer: Self-pay | Admitting: Orthopedic Surgery

## 2020-10-29 ENCOUNTER — Ambulatory Visit (INDEPENDENT_AMBULATORY_CARE_PROVIDER_SITE_OTHER): Payer: Medicare Other | Admitting: Orthopedic Surgery

## 2020-10-29 VITALS — BP 174/98 | HR 86 | Ht 65.0 in | Wt 211.0 lb

## 2020-10-29 DIAGNOSIS — G5622 Lesion of ulnar nerve, left upper limb: Secondary | ICD-10-CM

## 2020-10-29 MED ORDER — GABAPENTIN 300 MG PO CAPS: 300.0000 mg | ORAL_CAPSULE | Freq: Three times a day (TID) | ORAL | 0 refills | Status: AC

## 2020-10-29 NOTE — Progress Notes (Signed)
Orthopaedic Clinic Return  Assessment: Heather [name] is a 55 y.o. female with the following: Left cubital tunnel syndrome  Plan: Patient has pain, numbness and tingling to the ulnar left hand.  She previously had symptoms of carpal tunnel syndrome, but these have improved.  The ulnar nerve is irritated at her elbow.  She has good strength and no signs of atrophy.  As a result, I anticipate that her symptoms will improve with bracing.  We will place a referral to OT for a night time extension splint.  She has also taken Gabapentin in the past, but at a lower dose.  We will try a higher dose of Gabapentin to help with her nerve related pain.  Follow up as needed.   Meds ordered this encounter  Medications   gabapentin (NEURONTIN) 300 MG capsule    Sig: Take 1 capsule (300 mg total) by mouth 3 (three) times daily.    Dispense:  90 capsule    Refill:  0    Body mass index is 35.11 kg/m.  Follow-up: Return if symptoms worsen or fail to improve.   Subjective:  Chief Complaint  Patient presents with   Hand Pain    LT/ pain is worse    History of Present Illness: Heather [name] is a 55 y.o. female who returns to clinic for repeat evaluation of left hand pain.  She was previously seen in clinic for symptoms related to left carpal tunnel syndrome.  Her pain and symptoms are now isolated to the ulnar hand.  This has been ongoing for a couple of months.  Pain is worse at night.  Gabapentin did not help at a low dose.     Review of Systems: No fevers or chills + numbness and tingling No chest pain No shortness of breath No bowel or bladder dysfunction No GI distress No headaches   Objective: BP (!) 174/98   Pulse 86   Ht 5\' 5"  (1.651 m)   Wt 211 lb (95.7 kg)   BMI 35.11 kg/m   Physical Exam:  Left hand without pain.  No atrophy.  Decreased sensation to small and ring finger.  Positive Tinel's at the elbow.  Positive Phalen's at the elbow.  Negative Tinel's at the wrist.    IMAGING: I personally ordered and reviewed the following images:  No new imaging obtained today.    Mordecai Rasmussen, MD 10/29/2020 6:16 PM

## 2020-11-09 ENCOUNTER — Ambulatory Visit (HOSPITAL_COMMUNITY): Payer: Medicare Other | Attending: Orthopedic Surgery | Admitting: Occupational Therapy

## 2020-12-07 ENCOUNTER — Ambulatory Visit (HOSPITAL_COMMUNITY): Payer: Medicare Other | Admitting: Occupational Therapy

## 2020-12-13 ENCOUNTER — Ambulatory Visit (HOSPITAL_COMMUNITY): Payer: Medicare Other | Attending: Orthopedic Surgery

## 2020-12-13 DIAGNOSIS — M25522 Pain in left elbow: Secondary | ICD-10-CM | POA: Insufficient documentation

## 2020-12-20 ENCOUNTER — Ambulatory Visit (HOSPITAL_COMMUNITY): Payer: Medicare Other

## 2020-12-20 ENCOUNTER — Other Ambulatory Visit: Payer: Self-pay

## 2020-12-20 ENCOUNTER — Encounter (HOSPITAL_COMMUNITY): Payer: Self-pay

## 2020-12-20 DIAGNOSIS — M25522 Pain in left elbow: Secondary | ICD-10-CM | POA: Diagnosis not present

## 2020-12-20 NOTE — Patient Instructions (Signed)
Your Splint This splint should initially be fitted by a healthcare practitioner.  The healthcare practitioner is responsible for providing wearing instructions and precautions to the patient, other healthcare practitioners and care provider involved in the patient's care.  This splint was custom made for you. Please read the following instructions to learn about wearing and caring for your splint.  Precautions Should your splint cause any of the following problems, remove the splint immediately and contact your therapist/physician. Swelling Severe Pain Pressure Areas Stiffness Numbness  Do not wear your splint while operating machinery unless it has been fabricated for that purpose.  When To Wear Your Splint Where your splint according to your therapist/physician instructions. Nighttime only.  Care and Cleaning of Your Splint Keep your splint away from open flames. Your splint will lose its shape in temperatures over 135 degrees Farenheit, ( in car windows, near radiators, ovens or in hot water).  Never make any adjustments to your splint, if the splint needs adjusting remove it and make an appointment to see your therapist. Your splint, including the cushion liner may be cleaned with soap and lukewarm water.  Do not immerse in hot water over 135 degrees Farenheit. Straps may be washed with soap and water, but do not moisten the self-adhesive portion. For ink or hard to remove spots use a scouring cleanser which contains chlorine.  Rinse the splint thoroughly after using chlorine cleanser.
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
## 2022-03-06 ENCOUNTER — Encounter: Payer: Self-pay | Admitting: Radiology

## 2022-07-17 ENCOUNTER — Other Ambulatory Visit (HOSPITAL_COMMUNITY): Payer: Self-pay | Admitting: Internal Medicine

## 2022-07-17 DIAGNOSIS — Z1231 Encounter for screening mammogram for malignant neoplasm of breast: Secondary | ICD-10-CM

## 2022-09-15 ENCOUNTER — Inpatient Hospital Stay (HOSPITAL_COMMUNITY): Admission: RE | Admit: 2022-09-15 | Payer: Medicare Other | Source: Ambulatory Visit
